# Patient Record
Sex: Female | Born: 1965 | Race: White | Hispanic: No | Marital: Married | Smoking: Never smoker
Health system: Southern US, Community
[De-identification: ages and names within clinical notes are randomized; demographics above are authoritative.]

## PROBLEM LIST (undated history)

## (undated) DIAGNOSIS — F419 Anxiety disorder, unspecified: Secondary | ICD-10-CM

## (undated) DIAGNOSIS — E7800 Pure hypercholesterolemia, unspecified: Secondary | ICD-10-CM

## (undated) DIAGNOSIS — G2581 Restless legs syndrome: Secondary | ICD-10-CM

## (undated) DIAGNOSIS — F32A Depression, unspecified: Secondary | ICD-10-CM

## (undated) DIAGNOSIS — R768 Other specified abnormal immunological findings in serum: Secondary | ICD-10-CM

## (undated) DIAGNOSIS — I7300 Raynaud's syndrome without gangrene: Secondary | ICD-10-CM

## (undated) DIAGNOSIS — D649 Anemia, unspecified: Secondary | ICD-10-CM

## (undated) DIAGNOSIS — Z8719 Personal history of other diseases of the digestive system: Secondary | ICD-10-CM

## (undated) DIAGNOSIS — F418 Other specified anxiety disorders: Secondary | ICD-10-CM

## (undated) DIAGNOSIS — G4730 Sleep apnea, unspecified: Secondary | ICD-10-CM

## (undated) DIAGNOSIS — R519 Headache, unspecified: Secondary | ICD-10-CM

## (undated) DIAGNOSIS — I10 Essential (primary) hypertension: Secondary | ICD-10-CM

## (undated) DIAGNOSIS — J45909 Unspecified asthma, uncomplicated: Secondary | ICD-10-CM

## (undated) DIAGNOSIS — M1990 Unspecified osteoarthritis, unspecified site: Secondary | ICD-10-CM

## (undated) DIAGNOSIS — I639 Cerebral infarction, unspecified: Secondary | ICD-10-CM

## (undated) DIAGNOSIS — I679 Cerebrovascular disease, unspecified: Secondary | ICD-10-CM

## (undated) DIAGNOSIS — K219 Gastro-esophageal reflux disease without esophagitis: Secondary | ICD-10-CM

## (undated) DIAGNOSIS — G4700 Insomnia, unspecified: Secondary | ICD-10-CM

## (undated) DIAGNOSIS — H47333 Pseudopapilledema of optic disc, bilateral: Secondary | ICD-10-CM

## (undated) DIAGNOSIS — M0579 Rheumatoid arthritis with rheumatoid factor of multiple sites without organ or systems involvement: Secondary | ICD-10-CM

## (undated) DIAGNOSIS — G43909 Migraine, unspecified, not intractable, without status migrainosus: Secondary | ICD-10-CM

## (undated) DIAGNOSIS — Z8673 Personal history of transient ischemic attack (TIA), and cerebral infarction without residual deficits: Secondary | ICD-10-CM

## (undated) DIAGNOSIS — Z7960 Long term (current) use of unspecified immunomodulators and immunosuppressants: Secondary | ICD-10-CM

## (undated) DIAGNOSIS — R51 Headache: Secondary | ICD-10-CM

## (undated) HISTORY — PX: KNEE ARTHROSCOPY: SUR90

## (undated) HISTORY — PX: TONSILLECTOMY AND ADENOIDECTOMY: SUR1326

## (undated) HISTORY — PX: TONSILLECTOMY: SUR1361

## (undated) HISTORY — PX: WISDOM TOOTH EXTRACTION: SHX21

## (undated) HISTORY — PX: DILATION AND CURETTAGE OF UTERUS: SHX78

## (undated) HISTORY — PX: APPENDECTOMY: SHX54

## (undated) HISTORY — PX: OOPHORECTOMY: SHX86

## (undated) HISTORY — PX: OTHER SURGICAL HISTORY: SHX169

---

## 1999-01-03 HISTORY — PX: ABDOMINAL HYSTERECTOMY: SHX81

## 2003-11-19 ENCOUNTER — Emergency Department: Payer: Self-pay | Admitting: Emergency Medicine

## 2004-08-16 ENCOUNTER — Other Ambulatory Visit: Payer: Self-pay

## 2004-08-16 ENCOUNTER — Emergency Department: Payer: Self-pay | Admitting: Emergency Medicine

## 2004-11-27 ENCOUNTER — Emergency Department: Payer: Self-pay | Admitting: Emergency Medicine

## 2005-07-25 ENCOUNTER — Other Ambulatory Visit: Payer: Self-pay

## 2005-07-25 ENCOUNTER — Emergency Department: Payer: Self-pay | Admitting: Emergency Medicine

## 2005-12-03 ENCOUNTER — Emergency Department: Payer: Self-pay | Admitting: Unknown Physician Specialty

## 2005-12-03 ENCOUNTER — Other Ambulatory Visit: Payer: Self-pay

## 2006-02-09 ENCOUNTER — Ambulatory Visit: Payer: Self-pay | Admitting: Gastroenterology

## 2006-03-22 ENCOUNTER — Ambulatory Visit: Payer: Self-pay | Admitting: Gastroenterology

## 2006-04-22 ENCOUNTER — Emergency Department: Payer: Self-pay | Admitting: Emergency Medicine

## 2007-03-17 ENCOUNTER — Ambulatory Visit: Payer: Self-pay | Admitting: Family Medicine

## 2010-07-15 ENCOUNTER — Ambulatory Visit: Payer: Self-pay | Admitting: Internal Medicine

## 2011-03-14 ENCOUNTER — Ambulatory Visit: Payer: Self-pay | Admitting: Internal Medicine

## 2011-04-12 ENCOUNTER — Ambulatory Visit: Payer: Self-pay | Admitting: Family Medicine

## 2013-05-10 ENCOUNTER — Emergency Department: Payer: Self-pay | Admitting: Emergency Medicine

## 2013-05-10 LAB — BASIC METABOLIC PANEL
Anion Gap: 5 — ABNORMAL LOW (ref 7–16)
BUN: 14 mg/dL (ref 7–18)
CREATININE: 0.78 mg/dL (ref 0.60–1.30)
Calcium, Total: 9.1 mg/dL (ref 8.5–10.1)
Chloride: 106 mmol/L (ref 98–107)
Co2: 27 mmol/L (ref 21–32)
GLUCOSE: 87 mg/dL (ref 65–99)
OSMOLALITY: 276 (ref 275–301)
Potassium: 4 mmol/L (ref 3.5–5.1)
Sodium: 138 mmol/L (ref 136–145)

## 2013-05-10 LAB — CBC
HCT: 43.6 % (ref 35.0–47.0)
HGB: 13.6 g/dL (ref 12.0–16.0)
MCH: 28 pg (ref 26.0–34.0)
MCHC: 31.3 g/dL — AB (ref 32.0–36.0)
MCV: 90 fL (ref 80–100)
Platelet: 301 10*3/uL (ref 150–440)
RBC: 4.86 10*6/uL (ref 3.80–5.20)
RDW: 13.6 % (ref 11.5–14.5)
WBC: 11 10*3/uL (ref 3.6–11.0)

## 2013-05-10 LAB — TROPONIN I: Troponin-I: <0.02 ng/mL

## 2013-05-16 ENCOUNTER — Emergency Department: Payer: Self-pay | Admitting: Emergency Medicine

## 2013-05-28 ENCOUNTER — Emergency Department: Payer: Self-pay | Admitting: Emergency Medicine

## 2013-05-28 LAB — BASIC METABOLIC PANEL
Anion Gap: 11 (ref 7–16)
BUN: 12 mg/dL (ref 7–18)
CALCIUM: 9.1 mg/dL (ref 8.5–10.1)
CREATININE: 0.83 mg/dL (ref 0.60–1.30)
Chloride: 108 mmol/L — ABNORMAL HIGH (ref 98–107)
Co2: 21 mmol/L (ref 21–32)
EGFR (Non-African Amer.): >60
GLUCOSE: 107 mg/dL — AB (ref 65–99)
OSMOLALITY: 280 (ref 275–301)
POTASSIUM: 3.4 mmol/L — AB (ref 3.5–5.1)
SODIUM: 140 mmol/L (ref 136–145)

## 2013-05-28 LAB — CBC
HCT: 41.7 % (ref 35.0–47.0)
HGB: 13.6 g/dL (ref 12.0–16.0)
MCH: 29.2 pg (ref 26.0–34.0)
MCHC: 32.6 g/dL (ref 32.0–36.0)
MCV: 90 fL (ref 80–100)
Platelet: 370 10*3/uL (ref 150–440)
RBC: 4.66 10*6/uL (ref 3.80–5.20)
RDW: 13.7 % (ref 11.5–14.5)
WBC: 11.2 10*3/uL — AB (ref 3.6–11.0)

## 2013-05-28 LAB — TROPONIN I: Troponin-I: <0.02 ng/mL

## 2014-08-10 ENCOUNTER — Other Ambulatory Visit: Payer: Self-pay | Admitting: Family Medicine

## 2014-08-10 DIAGNOSIS — Z1231 Encounter for screening mammogram for malignant neoplasm of breast: Secondary | ICD-10-CM

## 2014-08-21 ENCOUNTER — Ambulatory Visit: Payer: Self-pay

## 2014-09-11 ENCOUNTER — Ambulatory Visit
Admission: RE | Admit: 2014-09-11 | Discharge: 2014-09-11 | Disposition: A | Discharge status: Home / Self Care | Payer: No Typology Code available for payment source | Source: Ambulatory Visit | Attending: Family Medicine | Admitting: Family Medicine

## 2014-09-11 DIAGNOSIS — Z1231 Encounter for screening mammogram for malignant neoplasm of breast: Secondary | ICD-10-CM | POA: Diagnosis not present

## 2015-03-18 ENCOUNTER — Other Ambulatory Visit: Payer: Self-pay | Admitting: Family Medicine

## 2015-03-18 DIAGNOSIS — Z1231 Encounter for screening mammogram for malignant neoplasm of breast: Secondary | ICD-10-CM

## 2015-09-17 ENCOUNTER — Ambulatory Visit
Admission: RE | Admit: 2015-09-17 | Discharge: 2015-09-17 | Disposition: A | Discharge status: Home / Self Care | Payer: No Typology Code available for payment source | Source: Ambulatory Visit | Attending: Family Medicine | Admitting: Family Medicine

## 2015-09-17 DIAGNOSIS — R928 Other abnormal and inconclusive findings on diagnostic imaging of breast: Secondary | ICD-10-CM | POA: Insufficient documentation

## 2015-09-17 DIAGNOSIS — Z1231 Encounter for screening mammogram for malignant neoplasm of breast: Secondary | ICD-10-CM

## 2015-09-21 ENCOUNTER — Other Ambulatory Visit: Payer: Self-pay | Admitting: Family Medicine

## 2015-09-21 DIAGNOSIS — N6310 Unspecified lump in the right breast, unspecified quadrant: Secondary | ICD-10-CM

## 2015-09-22 ENCOUNTER — Ambulatory Visit
Admission: RE | Admit: 2015-09-22 | Discharge: 2015-09-22 | Disposition: A | Discharge status: Home / Self Care | Payer: No Typology Code available for payment source | Source: Ambulatory Visit | Attending: Family Medicine | Admitting: Family Medicine

## 2015-09-22 DIAGNOSIS — N63 Unspecified lump in breast: Secondary | ICD-10-CM | POA: Insufficient documentation

## 2015-09-22 DIAGNOSIS — N6310 Unspecified lump in the right breast, unspecified quadrant: Secondary | ICD-10-CM

## 2015-10-05 ENCOUNTER — Other Ambulatory Visit: Payer: Self-pay | Admitting: Family Medicine

## 2015-10-05 DIAGNOSIS — R928 Other abnormal and inconclusive findings on diagnostic imaging of breast: Secondary | ICD-10-CM

## 2015-10-12 ENCOUNTER — Other Ambulatory Visit: Payer: Self-pay | Admitting: Family Medicine

## 2015-10-12 DIAGNOSIS — R928 Other abnormal and inconclusive findings on diagnostic imaging of breast: Secondary | ICD-10-CM

## 2016-01-04 ENCOUNTER — Other Ambulatory Visit: Payer: Self-pay | Admitting: Gastroenterology

## 2016-01-04 DIAGNOSIS — R1310 Dysphagia, unspecified: Secondary | ICD-10-CM

## 2016-01-11 ENCOUNTER — Ambulatory Visit: Payer: No Typology Code available for payment source

## 2016-01-14 ENCOUNTER — Ambulatory Visit: Payer: No Typology Code available for payment source

## 2016-01-21 ENCOUNTER — Ambulatory Visit
Admission: RE | Admit: 2016-01-21 | Discharge: 2016-01-21 | Disposition: A | Discharge status: Home / Self Care | Payer: No Typology Code available for payment source | Source: Ambulatory Visit | Attending: Gastroenterology | Admitting: Gastroenterology

## 2016-01-21 DIAGNOSIS — R1310 Dysphagia, unspecified: Secondary | ICD-10-CM | POA: Diagnosis present

## 2016-01-21 DIAGNOSIS — K222 Esophageal obstruction: Secondary | ICD-10-CM | POA: Insufficient documentation

## 2016-01-21 DIAGNOSIS — K449 Diaphragmatic hernia without obstruction or gangrene: Secondary | ICD-10-CM | POA: Insufficient documentation

## 2016-04-12 ENCOUNTER — Encounter: Payer: Self-pay | Admitting: *Deleted

## 2016-04-13 ENCOUNTER — Ambulatory Visit: Payer: No Typology Code available for payment source | Admitting: Anesthesiology

## 2016-04-13 ENCOUNTER — Ambulatory Visit
Admission: RE | Admit: 2016-04-13 | Discharge: 2016-04-13 | Disposition: A | Discharge status: Home / Self Care | Payer: No Typology Code available for payment source | Source: Ambulatory Visit | Attending: Gastroenterology | Admitting: Gastroenterology

## 2016-04-13 ENCOUNTER — Encounter
Admission: RE | Disposition: A | Discharge status: Home / Self Care | Payer: Self-pay | Source: Ambulatory Visit | Attending: Gastroenterology

## 2016-04-13 ENCOUNTER — Encounter: Payer: Self-pay | Admitting: Anesthesiology

## 2016-04-13 DIAGNOSIS — Z882 Allergy status to sulfonamides status: Secondary | ICD-10-CM | POA: Insufficient documentation

## 2016-04-13 DIAGNOSIS — I10 Essential (primary) hypertension: Secondary | ICD-10-CM | POA: Insufficient documentation

## 2016-04-13 DIAGNOSIS — Z7951 Long term (current) use of inhaled steroids: Secondary | ICD-10-CM | POA: Diagnosis not present

## 2016-04-13 DIAGNOSIS — G2581 Restless legs syndrome: Secondary | ICD-10-CM | POA: Insufficient documentation

## 2016-04-13 DIAGNOSIS — Z79899 Other long term (current) drug therapy: Secondary | ICD-10-CM | POA: Insufficient documentation

## 2016-04-13 DIAGNOSIS — Z885 Allergy status to narcotic agent status: Secondary | ICD-10-CM | POA: Insufficient documentation

## 2016-04-13 DIAGNOSIS — G4700 Insomnia, unspecified: Secondary | ICD-10-CM | POA: Diagnosis not present

## 2016-04-13 DIAGNOSIS — K2950 Unspecified chronic gastritis without bleeding: Secondary | ICD-10-CM | POA: Diagnosis not present

## 2016-04-13 DIAGNOSIS — R1013 Epigastric pain: Secondary | ICD-10-CM | POA: Insufficient documentation

## 2016-04-13 DIAGNOSIS — K449 Diaphragmatic hernia without obstruction or gangrene: Secondary | ICD-10-CM | POA: Diagnosis not present

## 2016-04-13 DIAGNOSIS — K219 Gastro-esophageal reflux disease without esophagitis: Secondary | ICD-10-CM | POA: Insufficient documentation

## 2016-04-13 DIAGNOSIS — Z91040 Latex allergy status: Secondary | ICD-10-CM | POA: Insufficient documentation

## 2016-04-13 DIAGNOSIS — K222 Esophageal obstruction: Secondary | ICD-10-CM | POA: Insufficient documentation

## 2016-04-13 DIAGNOSIS — J45909 Unspecified asthma, uncomplicated: Secondary | ICD-10-CM | POA: Insufficient documentation

## 2016-04-13 HISTORY — DX: Essential (primary) hypertension: I10

## 2016-04-13 HISTORY — PX: ESOPHAGOGASTRODUODENOSCOPY (EGD) WITH PROPOFOL: SHX5813

## 2016-04-13 HISTORY — DX: Insomnia, unspecified: G47.00

## 2016-04-13 HISTORY — DX: Restless legs syndrome: G25.81

## 2016-04-13 HISTORY — DX: Headache, unspecified: R51.9

## 2016-04-13 HISTORY — DX: Headache: R51

## 2016-04-13 SURGERY — ESOPHAGOGASTRODUODENOSCOPY (EGD) WITH PROPOFOL
Anesthesia: General

## 2016-04-13 MED ORDER — FENTANYL CITRATE (PF) 100 MCG/2ML IJ SOLN
INTRAMUSCULAR | Status: AC
  Filled 2016-04-13: qty 2

## 2016-04-13 MED ORDER — LIDOCAINE HCL (PF) 2 % IJ SOLN
INTRAMUSCULAR | Status: AC
  Filled 2016-04-13: qty 2

## 2016-04-13 MED ORDER — FENTANYL CITRATE (PF) 100 MCG/2ML IJ SOLN
INTRAMUSCULAR | Status: DC | PRN
  Administered 2016-04-13 (×2): 50 ug via INTRAVENOUS

## 2016-04-13 MED ORDER — SODIUM CHLORIDE 0.9 % IV SOLN: INTRAVENOUS | Status: DC

## 2016-04-13 MED ORDER — GLYCOPYRROLATE 0.2 MG/ML IJ SOLN
INTRAMUSCULAR | Status: DC | PRN
  Administered 2016-04-13: 0.1 mg via INTRAVENOUS

## 2016-04-13 MED ORDER — MIDAZOLAM HCL 2 MG/2ML IJ SOLN
INTRAMUSCULAR | Status: AC
  Filled 2016-04-13: qty 2

## 2016-04-13 MED ORDER — MIDAZOLAM HCL 2 MG/2ML IJ SOLN
INTRAMUSCULAR | Status: DC | PRN
Start: 2016-04-13 — End: 2016-04-13
  Administered 2016-04-13 (×2): 1 mg via INTRAVENOUS

## 2016-04-13 MED ORDER — PROPOFOL 500 MG/50ML IV EMUL
INTRAVENOUS | Status: AC
  Filled 2016-04-13: qty 50

## 2016-04-13 MED ORDER — SODIUM CHLORIDE 0.9 % IV SOLN
INTRAVENOUS | Status: DC
  Administered 2016-04-13: 09:00:00 via INTRAVENOUS

## 2016-04-13 MED ORDER — GLYCOPYRROLATE 0.2 MG/ML IJ SOLN
INTRAMUSCULAR | Status: AC
  Filled 2016-04-13: qty 1

## 2016-04-13 MED ORDER — LIDOCAINE HCL (CARDIAC) 20 MG/ML IV SOLN
INTRAVENOUS | Status: DC | PRN
  Administered 2016-04-13: 30 mg via INTRAVENOUS

## 2016-04-13 MED ORDER — SODIUM CHLORIDE 0.9 % IV SOLN
INTRAVENOUS | Status: DC
Start: 2016-04-13 — End: 2016-04-13

## 2016-04-13 MED ORDER — PROPOFOL 500 MG/50ML IV EMUL
INTRAVENOUS | Status: DC | PRN
  Administered 2016-04-13: 120 ug/kg/min via INTRAVENOUS

## 2016-04-13 NOTE — Anesthesia Procedure Notes (Signed)
Performed by: COOK-MARTIN, Alie Moudy Pre-anesthesia Checklist: Patient identified, Emergency Drugs available, Suction available, Patient being monitored and Timeout performed Patient Re-evaluated:Patient Re-evaluated prior to inductionOxygen Delivery Method: Nasal cannula Preoxygenation: Pre-oxygenation with 100% oxygen Intubation Type: IV induction Placement Confirmation: positive ETCO2 and CO2 detector       

## 2016-04-13 NOTE — Anesthesia Procedure Notes (Signed)
Performed by: COOK-MARTIN, Jenayah Antu Pre-anesthesia Checklist: Patient identified, Emergency Drugs available, Suction available, Patient being monitored and Timeout performed Patient Re-evaluated:Patient Re-evaluated prior to inductionOxygen Delivery Method: Nasal cannula Preoxygenation: Pre-oxygenation with 100% oxygen Intubation Type: IV induction Airway Equipment and Method: Bite block Placement Confirmation: CO2 detector and positive ETCO2     

## 2016-04-13 NOTE — Anesthesia Postprocedure Evaluation (Signed)
Anesthesia Post Note  Patient: Brandi Rose  Procedure(s) Performed: Procedure(s) (LRB): ESOPHAGOGASTRODUODENOSCOPY (EGD) WITH PROPOFOL (N/A)  Patient location during evaluation: Endoscopy Anesthesia Type: General Level of consciousness: awake and alert Pain management: pain level controlled Vital Signs Assessment: post-procedure vital signs reviewed and stable Respiratory status: spontaneous breathing, nonlabored ventilation, respiratory function stable and patient connected to nasal cannula oxygen Cardiovascular status: blood pressure returned to baseline and stable Postop Assessment: no signs of nausea or vomiting Anesthetic complications: no     Last Vitals:  Vitals:   04/13/16 1020 04/13/16 1030  BP: (!) 144/99 (!) 154/93  Pulse: 86 82  Resp: 13 15  Temp:      Last Pain:  Vitals:   04/13/16 1010  TempSrc: Tympanic                 Brandi Rose

## 2016-04-13 NOTE — Anesthesia Post-op Follow-up Note (Cosign Needed)
Anesthesia QCDR form completed.        

## 2016-04-13 NOTE — H&P (Addendum)
Outpatient short stay form Pre-procedure 04/13/2016 9:18 AM Brandi Deem MD  Primary Physician: Dr. Leim Fabry  Reason for visit:  EGD  History of present illness:  Patient is a 51 year old female presenting today with complaint of dysphagia, GERD and epigastric pain. She did have an EGD over 10 years ago that included an empiric dilatation. She did have a barium swallow done on 01/21/2016 that showed a possible B ring in the distal esophagus however  barium tablet did not catch. She has not been taking a proton pump inhibitor until she saw an outpatient. He is now currently taking that feels it may be some improvement. He was previously regurgitating foods. Patient denies any aspirin products or blood thinning agents.    Current Facility-Administered Medications:  .  0.9 %  sodium chloride infusion, , Intravenous, Continuous, Brandi Deem, MD .  0.9 %  sodium chloride infusion, , Intravenous, Continuous, Brandi Deem, MD, Last Rate: 20 mL/hr at 04/13/16 0918 .  0.9 %  sodium chloride infusion, , Intravenous, Continuous, Brandi Deem, MD  Prescriptions Prior to Admission  Medication Sig Dispense Refill Last Dose  . ALBUTEROL IN Inhale 2 Inhalers into the lungs 4 (four) times daily. 2 inhalations into the lungs every 4 hours as needed for wheezing     . ARIPiprazole (ABILIFY) 5 MG tablet Take 5 mg by mouth daily.   04/12/2016 at Unknown time  . buPROPion (WELLBUTRIN XL) 300 MG 24 hr tablet Take 300 mg by mouth daily.   04/12/2016 at Unknown time  . gabapentin (NEURONTIN) 300 MG capsule Take 600 mg by mouth at bedtime. 2 capsules (600mg  total) by mouth nightly   04/12/2016 at Unknown time  . SUMAtriptan (IMITREX) 50 MG tablet Take 50 mg by mouth once. May repeat in 2 hours if headache persists or recurs.   Past Month at Unknown time  . traZODone (DESYREL) 50 MG tablet Take 50 mg by mouth at bedtime.   04/12/2016 at Unknown time  . valACYclovir (VALTREX) 1000 MG tablet Take  1,000 mg by mouth.   Past Month at Unknown time     Allergies  Allergen Reactions  . Latex   . Sulfa Antibiotics Other (See Comments)    Bad headach  . Vicodin [Hydrocodone-Acetaminophen]      Past Medical History:  Diagnosis Date  . Headache   . Hypertension   . Insomnia   . RLS (restless legs syndrome)     Review of systems:      Physical Exam    Heart and lungs: Regular rate and rhythm without rub or gallop, lungs are bilaterally clear.    HEENT: Normocephalic atraumatic eyes are anicteric    Other:     Pertinant exam for procedure: Soft nontender nondistended bowel sounds positive normoactive.    Planned proceedures: EGD and indicated procedures. I have discussed the risks benefits and complications of procedures to include not limited to bleeding, infection, perforation and the risk of sedation and the patient wishes to proceed.    06/12/2016, MD Gastroenterology 04/13/2016  9:18 AM

## 2016-04-13 NOTE — Op Note (Signed)
[facility] Gastroenterology Patient Name: Brandi Rose Procedure Date: 04/13/2016 9:07 AM MRN: [id number] Account #: [id number] Date of Birth: 01-Nov-1965 Admit Type: Outpatient Age: 51 Room: [facility] ENDO ROOM 1 Gender: Female Note Status: Finalized Procedure:            Upper GI endoscopy Indications:          Epigastric abdominal pain, Dysphagia Providers:            Christena Deem, MD Referring MD:         Leim Fabry MD, MD (Referring MD) Medicines:            Monitored Anesthesia Care Complications:        No immediate complications. Procedure:            Pre-Anesthesia Assessment:                       - ASA Grade Assessment: II - A patient with mild                        systemic disease.                       After obtaining informed consent, the endoscope was                        passed under direct vision. Throughout the procedure,                        the patient's blood pressure, pulse, and oxygen                        saturations were monitored continuously. The Endoscope                        was introduced through the mouth, and advanced to the                        third part of duodenum. The upper GI endoscopy was                        accomplished without difficulty. The patient tolerated                        the procedure well. Findings:      A small hiatal hernia was found. The Z-line was a variable distance from       incisors; the hiatal hernia was sliding.      A low-grade of narrowing Schatzki ring (acquired) was found at the       gastroesophageal junction. A TTS dilator was passed through the scope.       Dilation with a 15-16.5-18 mm balloon dilator was performed to 18 mm       with opening of the ring at 18 mm. Biopsies were taken with a cold       forceps for histology.      Patchy minimal inflammation characterized by erythema was found in the       gastric body. Biopsies were taken with a cold forceps for  histology.       Biopsies were taken with a cold forceps for Helicobacter pylori testing.      The cardia  and gastric fundus were normal on retroflexion.      The examined duodenum was normal. Impression:           - Small hiatal hernia.                       - Low-grade of narrowing Schatzki ring. Dilated.                        Biopsied.                       - Gastritis. Biopsied.                       - Normal examined duodenum. Recommendation:       - Clear liquid diet today.                       - Full liquid diet for 1 day, then advance as tolerated                        to soft diet for 4 days. Procedure Code(s):    --- Professional ---                       [phone], Esophagogastroduodenoscopy, flexible, transoral;                        with transendoscopic balloon dilation of esophagus                        (less than 30 mm diameter)                       43239, Esophagogastroduodenoscopy, flexible, transoral;                        with biopsy, single or multiple Diagnosis Code(s):    --- Professional ---                       K44.9, Diaphragmatic hernia without obstruction or                        gangrene                       K22.2, Esophageal obstruction                       K29.70, Gastritis, unspecified, without bleeding                       R10.13, Epigastric pain                       R13.10, Dysphagia, unspecified CPT copyright 2016 American Medical Association. All rights reserved. The codes documented in this report are preliminary and upon coder review may  be revised to meet current compliance requirements. Christena Deem, MD 04/13/2016 10:13:06 AM This report has been signed electronically. Number of Addenda: 0 Note Initiated On: 04/13/2016 9:07 AM      [facility]

## 2016-04-13 NOTE — Anesthesia Preprocedure Evaluation (Signed)
Anesthesia Evaluation  Patient identified by MRN, date of birth, ID band Patient awake    Reviewed: Allergy & Precautions, H&P , NPO status , Patient's Chart, lab work & pertinent test results, reviewed documented beta blocker date and time   History of Anesthesia Complications Negative for: history of anesthetic complications  Airway Mallampati: III  TM Distance: >3 FB Neck ROM: full  Mouth opening: Limited Mouth Opening  Dental  (+) Implants, Partial Upper, Chipped   Pulmonary neg shortness of breath, asthma , sleep apnea , neg COPD, neg recent URI,           Cardiovascular Exercise Tolerance: Good hypertension, (-) angina(-) CAD, (-) Past MI, (-) Cardiac Stents and (-) CABG (-) dysrhythmias + Valvular Problems/Murmurs      Neuro/Psych  Headaches, neg Seizures negative psych ROS   GI/Hepatic Neg liver ROS, GERD  ,  Endo/Other  negative endocrine ROS  Renal/GU negative Renal ROS  negative genitourinary   Musculoskeletal   Abdominal   Peds  Hematology negative hematology ROS (+)   Anesthesia Other Findings Past Medical History: No date: Headache No date: Hypertension No date: Insomnia No date: RLS (restless legs syndrome)   Reproductive/Obstetrics negative OB ROS                             Anesthesia Physical Anesthesia Plan  ASA: II  Anesthesia Plan: General   Post-op Pain Management:    Induction:   Airway Management Planned:   Additional Equipment:   Intra-op Plan:   Post-operative Plan:   Informed Consent: I have reviewed the patients History and Physical, chart, labs and discussed the procedure including the risks, benefits and alternatives for the proposed anesthesia with the patient or authorized representative who has indicated his/her understanding and acceptance.   Dental Advisory Given  Plan Discussed with: Anesthesiologist, CRNA and Surgeon  Anesthesia Plan  Comments:         Anesthesia Quick Evaluation

## 2016-04-13 NOTE — Transfer of Care (Signed)
Immediate Anesthesia Transfer of Care Note  Patient: Brandi Rose  Procedure(s) Performed: Procedure(s): ESOPHAGOGASTRODUODENOSCOPY (EGD) WITH PROPOFOL (N/A)  Patient Location: PACU  Anesthesia Type:General  Level of Consciousness: awake, oriented and sedated  Airway & Oxygen Therapy: Patient Spontanous Breathing and Patient connected to nasal cannula oxygen  Post-op Assessment: Report given to RN and Post -op Vital signs reviewed and stable  Post vital signs: Reviewed and stable  Last Vitals:  Vitals:   04/13/16 0856  BP: (!) 171/91  Pulse: 83  Resp: 20  Temp: 36.6 C    Last Pain:  Vitals:   04/13/16 0856  TempSrc: Oral         Complications: No apparent anesthesia complications

## 2016-04-14 ENCOUNTER — Encounter: Payer: Self-pay | Admitting: Gastroenterology

## 2016-04-14 LAB — SURGICAL PATHOLOGY

## 2016-05-18 ENCOUNTER — Inpatient Hospital Stay: Admission: RE | Admit: 2016-05-18 | Payer: No Typology Code available for payment source | Source: Ambulatory Visit

## 2016-05-18 ENCOUNTER — Other Ambulatory Visit: Payer: No Typology Code available for payment source

## 2016-06-09 ENCOUNTER — Ambulatory Visit
Admission: RE | Admit: 2016-06-09 | Discharge: 2016-06-09 | Disposition: A | Discharge status: Home / Self Care | Payer: No Typology Code available for payment source | Source: Ambulatory Visit | Attending: Family Medicine | Admitting: Family Medicine

## 2016-06-09 DIAGNOSIS — R928 Other abnormal and inconclusive findings on diagnostic imaging of breast: Secondary | ICD-10-CM | POA: Diagnosis present

## 2016-10-19 ENCOUNTER — Encounter: Payer: Self-pay | Admitting: *Deleted

## 2016-10-20 ENCOUNTER — Ambulatory Visit
Admission: RE | Admit: 2016-10-20 | Payer: No Typology Code available for payment source | Source: Ambulatory Visit | Admitting: Gastroenterology

## 2016-10-20 ENCOUNTER — Encounter: Admission: RE | Payer: Self-pay | Source: Ambulatory Visit

## 2016-10-20 HISTORY — DX: Unspecified asthma, uncomplicated: J45.909

## 2016-10-20 HISTORY — DX: Personal history of other diseases of the digestive system: Z87.19

## 2016-10-20 HISTORY — DX: Gastro-esophageal reflux disease without esophagitis: K21.9

## 2016-10-20 SURGERY — COLONOSCOPY WITH PROPOFOL
Anesthesia: General

## 2016-12-14 ENCOUNTER — Other Ambulatory Visit: Payer: Self-pay

## 2016-12-14 ENCOUNTER — Emergency Department: Payer: 59

## 2016-12-14 ENCOUNTER — Emergency Department
Admission: EM | Admit: 2016-12-14 | Discharge: 2016-12-14 | Disposition: A | Discharge status: Home / Self Care | Payer: 59 | Attending: Emergency Medicine | Admitting: Emergency Medicine

## 2016-12-14 DIAGNOSIS — J45909 Unspecified asthma, uncomplicated: Secondary | ICD-10-CM | POA: Insufficient documentation

## 2016-12-14 DIAGNOSIS — Z91040 Latex allergy status: Secondary | ICD-10-CM | POA: Insufficient documentation

## 2016-12-14 DIAGNOSIS — I10 Essential (primary) hypertension: Secondary | ICD-10-CM | POA: Insufficient documentation

## 2016-12-14 DIAGNOSIS — Z79899 Other long term (current) drug therapy: Secondary | ICD-10-CM | POA: Insufficient documentation

## 2016-12-14 DIAGNOSIS — K224 Dyskinesia of esophagus: Secondary | ICD-10-CM | POA: Insufficient documentation

## 2016-12-14 DIAGNOSIS — R220 Localized swelling, mass and lump, head: Secondary | ICD-10-CM | POA: Diagnosis present

## 2016-12-14 MED ORDER — ONDANSETRON HCL 4 MG/2ML IJ SOLN
4.0000 mg | Freq: Once | INTRAMUSCULAR | Status: AC
  Administered 2016-12-14: 4 mg via INTRAVENOUS

## 2016-12-14 MED ORDER — ONDANSETRON HCL 4 MG/2ML IJ SOLN
INTRAMUSCULAR | Status: AC
  Filled 2016-12-14: qty 2

## 2016-12-14 MED ORDER — GLUCAGON HCL RDNA (DIAGNOSTIC) 1 MG IJ SOLR
INTRAMUSCULAR | Status: AC
  Administered 2016-12-14: 1 mg via INTRAMUSCULAR
  Filled 2016-12-14: qty 1

## 2016-12-14 MED ORDER — GLUCAGON HCL RDNA (DIAGNOSTIC) 1 MG IJ SOLR
1.0000 mg | Freq: Once | INTRAMUSCULAR | Status: AC
  Administered 2016-12-14: 1 mg via INTRAMUSCULAR

## 2016-12-14 NOTE — ED Provider Notes (Signed)
[facility] Emergency Department Provider Note ____________________________________________   First MD Initiated Contact with Patient 12/14/16 1929     (approximate)  I have reviewed the triage vital signs and the nursing notes.   HISTORY  Chief Complaint Oral Swelling    HPI GALILEA RECLA is a 51 y.o. female with a history of esophageal spasm and Schatzki ring who presents with dysphagia, acute onset approximately 1 hour ago when she ate some bread, and feeling identical to prior esophageal spasm symptoms.  Patient states she is unable to hold anything down, and whenever she tries to swallow it comes right back up, including her saliva.  She denies any shortness of breath or tightness or swelling in her mouth or throat specifically.  No chest pain.  Patient reports discomfort along her esophagus in her neck.  She denies eating anything else besides the bread before this started.   Past Medical History:  Diagnosis Date  . Asthma   . GERD (gastroesophageal reflux disease)   . Headache   . History of IBS   . Hypertension   . Insomnia   . RLS (restless legs syndrome)     There are no active problems to display for this patient.   Past Surgical History:  Procedure Laterality Date  . ABDOMINAL HYSTERECTOMY  2001  . APPENDECTOMY    . ESOPHAGOGASTRODUODENOSCOPY (EGD) WITH PROPOFOL N/A 04/13/2016   Procedure: ESOPHAGOGASTRODUODENOSCOPY (EGD) WITH PROPOFOL;  Surgeon: Christena Deem, MD;  Location: [facility] ENDOSCOPY;  Service: Endoscopy;  Laterality: N/A;  . KNEE ARTHROSCOPY    . TONSILLECTOMY      Prior to Admission medications   Medication Sig Start Date End Date Taking? Authorizing Provider  ALBUTEROL IN Inhale 2 Inhalers into the lungs 4 (four) times daily. 2 inhalations into the lungs every 4 hours as needed for wheezing    [provider]  ARIPiprazole (ABILIFY) 5 MG tablet Take 5 mg by mouth daily.    [provider]    buPROPion (WELLBUTRIN XL) 300 MG 24 hr tablet Take 300 mg by mouth daily.    [provider]  fluticasone (VERAMYST) 27.5 MCG/SPRAY nasal spray Place 2 sprays into the nose daily.    [provider]  gabapentin (NEURONTIN) 300 MG capsule Take 600 mg by mouth at bedtime. 2 capsules (600mg  total) by mouth nightly    [provider]  lisinopril-hydrochlorothiazide (PRINZIDE,ZESTORETIC) 20-12.5 MG tablet Take 1 tablet by mouth daily.    [provider]  pantoprazole (PROTONIX) 40 MG tablet Take 40 mg by mouth daily.    [provider]  SUMAtriptan (IMITREX) 50 MG tablet Take 50 mg by mouth once. May repeat in 2 hours if headache persists or recurs.    [provider]  traZODone (DESYREL) 50 MG tablet Take 50 mg by mouth at bedtime.    [provider]  valACYclovir (VALTREX) 1000 MG tablet Take 1,000 mg by mouth.    [provider]    Allergies Latex; Sulfa antibiotics; and Vicodin [hydrocodone-acetaminophen]  Family History  Problem Relation Age of Onset  . Breast cancer Paternal Grandmother 39    Social History Social History   Tobacco Use  . Smoking status: Never Smoker  . Smokeless tobacco: Never Used  Substance Use Topics  . Alcohol use: No  . Drug use: No    Review of Systems  Constitutional: No fever. Eyes: No redness. ENT: No sore throat. Cardiovascular: Denies chest pain. Respiratory: Denies shortness of breath.  Gastrointestinal: Positive for dysphagia Genitourinary: Negative for flank pain.  Musculoskeletal: Negative for back pain. Skin: Negative for rash. Neurological: Negative for headache.    ____________________________________________   PHYSICAL EXAM:  VITAL SIGNS: ED Triage Vitals  Enc Vitals Group     BP 12/14/16 1911 (!) 221/115     Pulse Rate 12/14/16 1911 97     Resp 12/14/16 1911 20     Temp 12/14/16 1911 98.3 F (36.8 C)     Temp Source 12/14/16 1911 Oral     SpO2  12/14/16 1911 100 %     Weight 12/14/16 1914 210 lb (95.3 kg)     Height 12/14/16 1914 5\' 8"  (1.727 m)     Head Circumference --      Peak Flow --      Pain Score 12/14/16 1911 8     Pain Loc --      Pain Edu? --      Excl. in GC? --     Constitutional: Alert and oriented.  Uncomfortable appearing but in no acute distress. Eyes: Conjunctivae are normal.  Head: Atraumatic. Nose: No congestion/rhinnorhea. Mouth/Throat: Mucous membranes are moist.  Oropharynx is clear with no swelling.  Small amount of pooled secretions.  No stridor. Neck: Normal range of motion.  Cardiovascular: Normal rate, regular rhythm. Grossly normal heart sounds.  Good peripheral circulation. Respiratory: Normal respiratory effort.  No retractions. Lungs CTAB. Gastrointestinal: No distention.  Musculoskeletal: Extremities warm and well perfused.  Neurologic:  Normal speech and language. No gross focal neurologic deficits are appreciated.  Skin:  Skin is warm and dry. No rash noted. Psychiatric: Mood and affect are normal. Speech and behavior are normal.  ____________________________________________   LABS (all labs ordered are listed, but only abnormal results are displayed)  Labs Reviewed - No data to display ____________________________________________  EKG   ____________________________________________  RADIOLOGY  CXR: No acute findings.  ____________________________________________   PROCEDURES  Procedure(s) performed: No    Critical Care performed: No ____________________________________________   INITIAL IMPRESSION / ASSESSMENT AND PLAN / ED COURSE  Pertinent labs & imaging results that were available during my care of the patient were reviewed by me and considered in my medical decision making (see chart for details).  51 year old female with past medical history as noted above presents with symptoms similar to prior esophageal spasm after eating some bread, including discomfort  along her esophagus and inability to tolerate anything by mouth including her saliva.  Review of past medical records in Epic confirms that patient had endoscopy approximately 8 months ago during which she was found to have Schatzki ring and the esophagus was dilated.   Patient reports a few similar episodes that have resolved on their own but usually have not been as intense as this one.  On exam, patient is uncomfortable but not acutely ill-appearing.  She is significantly hypertensive, but other vital signs are normal.  She states she has not been compliant with her blood pressure medications.  The remainder of the exam is unremarkable except for the small amount of pooled secretions in the oropharynx, but there is no swelling in the oropharynx or evidence of airway involvement.  Presentation is consistent with esophageal spasm or stricture.  Given the patient was only eating bread which will dissolve, I am less concerned for food bolus as the primary cause.  Plan: Chest x-ray, IM glucagon, and reassess.  If patient's symptoms resolved and she is able to tolerate p.o. again, likely discharge home.  If  patient's symptoms persist we will consult GI.  Patient's hypertension is consistent with a patient in acute discomfort and especially since she is not compliant with her antihypertensives, and therefore is likely acute on chronic.  Patient has no headache, chest pain, difficulty breathing, or other symptoms of endorgan dysfunction or evidence of hypertensive emergency.  We will continue to monitor.    ----------------------------------------- 9:48 PM on 12/14/2016 -----------------------------------------  Patient reports significant improvement in her symptoms after the glucagon, and now is drinking ginger ale without difficulty and not having any regurgitation or vomiting.  Patient feels much better and would like to go home.  I instructed her to drink only liquids tonight, and advance to soft  food tomorrow.  I will provide referral back to her own gastroenterologist who had previously done an endoscopy on her, and instructed her to call tomorrow.  Patient's blood pressures also spontaneously improved, with the last blood pressure being 180s over 100s when I was in the room with her.  Patient continues to have no symptoms of hypertensive urgency.  Return precautions given and patient expresses understanding.  ____________________________________________   FINAL CLINICAL IMPRESSION(S) / ED DIAGNOSES  Final diagnoses:  Esophageal spasm      NEW MEDICATIONS STARTED DURING THIS VISIT:  This SmartLink is deprecated. Use AVSMEDLIST instead to display the medication list for a patient.   Note:  This document was prepared using Dragon voice recognition software and may include unintentional dictation errors.    Dionne Bucy, MD 12/14/16 2150

## 2016-12-14 NOTE — Discharge Instructions (Signed)
You should continue to drink only liquids tonight, advance to soft solid foods tomorrow.  You should call the gastroenterologist tomorrow to arrange a follow-up visit.  Return to the ER immediately for new, worsening, or persistent vomiting, spitting up, inability to swallow normally, neck or chest pain, difficulty breathing, tightness in her throat, or any other new or worsening symptoms that concern you.

## 2016-12-14 NOTE — ED Triage Notes (Addendum)
Pt presents to ED via POV from home with c/o throat/oral swelling x30 mins PTA. PT reports h/x of same requiring esophageal stretching. Pt unable to swallow oral secretions, no difficulty breathing or managing airway at this time.

## 2016-12-14 NOTE — ED Notes (Signed)
Pt reports esophageal spasms that started prioor

## 2017-01-06 ENCOUNTER — Encounter
Admission: EM | Disposition: A | Discharge status: Home / Self Care | Payer: Self-pay | Source: Home / Self Care | Attending: Student in an Organized Health Care Education/Training Program

## 2017-01-06 ENCOUNTER — Emergency Department: Payer: 59

## 2017-01-06 ENCOUNTER — Emergency Department
Admission: EM | Admit: 2017-01-06 | Discharge: 2017-01-06 | Disposition: A | Discharge status: Home / Self Care | Payer: 59 | Attending: Student in an Organized Health Care Education/Training Program | Admitting: Student in an Organized Health Care Education/Training Program

## 2017-01-06 DIAGNOSIS — R0789 Other chest pain: Secondary | ICD-10-CM | POA: Insufficient documentation

## 2017-01-06 DIAGNOSIS — Z91040 Latex allergy status: Secondary | ICD-10-CM | POA: Insufficient documentation

## 2017-01-06 DIAGNOSIS — J45909 Unspecified asthma, uncomplicated: Secondary | ICD-10-CM | POA: Insufficient documentation

## 2017-01-06 DIAGNOSIS — Z79899 Other long term (current) drug therapy: Secondary | ICD-10-CM | POA: Insufficient documentation

## 2017-01-06 DIAGNOSIS — K224 Dyskinesia of esophagus: Secondary | ICD-10-CM | POA: Diagnosis not present

## 2017-01-06 DIAGNOSIS — I10 Essential (primary) hypertension: Secondary | ICD-10-CM | POA: Insufficient documentation

## 2017-01-06 DIAGNOSIS — R079 Chest pain, unspecified: Secondary | ICD-10-CM | POA: Diagnosis present

## 2017-01-06 LAB — CBC
HEMATOCRIT: 39.9 % (ref 35.0–47.0)
Hemoglobin: 13.3 g/dL (ref 12.0–16.0)
MCH: 28.5 pg (ref 26.0–34.0)
MCHC: 33.3 g/dL (ref 32.0–36.0)
MCV: 85.8 fL (ref 80.0–100.0)
Platelets: 271 10*3/uL (ref 150–440)
RBC: 4.65 MIL/uL (ref 3.80–5.20)
RDW: 13.1 % (ref 11.5–14.5)
WBC: 5.2 10*3/uL (ref 3.6–11.0)

## 2017-01-06 LAB — BASIC METABOLIC PANEL
ANION GAP: 9 (ref 5–15)
BUN: 13 mg/dL (ref 6–20)
CO2: 23 mmol/L (ref 22–32)
Calcium: 8.8 mg/dL — ABNORMAL LOW (ref 8.9–10.3)
Chloride: 104 mmol/L (ref 101–111)
Creatinine, Ser: 0.89 mg/dL (ref 0.44–1.00)
GFR calc Af Amer: >60 mL/min (ref >60)
GLUCOSE: 102 mg/dL — AB (ref 65–99)
Potassium: 3.6 mmol/L (ref 3.5–5.1)
Sodium: 136 mmol/L (ref 135–145)

## 2017-01-06 LAB — TROPONIN I: Troponin I: <0.03 ng/mL (ref <0.03)

## 2017-01-06 SURGERY — EGD (ESOPHAGOGASTRODUODENOSCOPY)
Anesthesia: Choice

## 2017-01-06 MED ORDER — LORAZEPAM 2 MG/ML IJ SOLN
1.0000 mg | Freq: Once | INTRAMUSCULAR | Status: AC
  Administered 2017-01-06: 1 mg via INTRAVENOUS
  Filled 2017-01-06: qty 1

## 2017-01-06 MED ORDER — LABETALOL HCL 5 MG/ML IV SOLN
5.0000 mg | Freq: Once | INTRAVENOUS | Status: AC
  Administered 2017-01-06: 5 mg via INTRAVENOUS
  Filled 2017-01-06: qty 4

## 2017-01-06 MED ORDER — LORAZEPAM 2 MG/ML IJ SOLN
1.0000 mg | Freq: Once | INTRAMUSCULAR | Status: AC
  Administered 2017-01-06: 1 mg via INTRAVENOUS

## 2017-01-06 MED ORDER — LORAZEPAM 2 MG/ML IJ SOLN
INTRAMUSCULAR | Status: AC
  Filled 2017-01-06: qty 1

## 2017-01-06 MED ORDER — LIDOCAINE VISCOUS 2 % MT SOLN: 20.0000 mL | OROMUCOSAL | 0 refills | Status: DC | PRN

## 2017-01-06 MED ORDER — MORPHINE SULFATE (PF) 4 MG/ML IV SOLN
4.0000 mg | INTRAVENOUS | Status: DC | PRN
  Administered 2017-01-06: 4 mg via INTRAVENOUS
  Filled 2017-01-06: qty 1

## 2017-01-06 MED ORDER — PANTOPRAZOLE SODIUM 40 MG IV SOLR
40.0000 mg | Freq: Once | INTRAVENOUS | Status: AC
  Administered 2017-01-06: 40 mg via INTRAVENOUS
  Filled 2017-01-06: qty 40

## 2017-01-06 MED ORDER — LIDOCAINE VISCOUS 2 % MT SOLN
15.0000 mL | Freq: Once | OROMUCOSAL | Status: AC
  Administered 2017-01-06: 15 mL via OROMUCOSAL
  Filled 2017-01-06: qty 15

## 2017-01-06 MED ORDER — GLUCAGON HCL (RDNA) 1 MG IJ SOLR
1.0000 mg | Freq: Once | INTRAMUSCULAR | Status: AC
  Administered 2017-01-06: 1 mg via INTRAVENOUS
  Filled 2017-01-06: qty 1

## 2017-01-06 MED ORDER — PANTOPRAZOLE SODIUM 40 MG PO TBEC: 40.0000 mg | DELAYED_RELEASE_TABLET | Freq: Every day | ORAL | 1 refills | Status: DC

## 2017-01-06 MED ORDER — ONDANSETRON HCL 4 MG/2ML IJ SOLN
4.0000 mg | Freq: Once | INTRAMUSCULAR | Status: AC
  Administered 2017-01-06: 4 mg via INTRAVENOUS
  Filled 2017-01-06: qty 2

## 2017-01-06 MED ORDER — GLUCAGON HCL RDNA (DIAGNOSTIC) 1 MG IJ SOLR
INTRAMUSCULAR | Status: AC
  Administered 2017-01-06: 1 mg via INTRAVENOUS
  Filled 2017-01-06: qty 1

## 2017-01-06 MED ORDER — PROMETHAZINE HCL 25 MG/ML IJ SOLN
12.5000 mg | Freq: Four times a day (QID) | INTRAMUSCULAR | Status: DC | PRN
  Administered 2017-01-06: 12.5 mg via INTRAVENOUS
  Filled 2017-01-06: qty 1

## 2017-01-06 NOTE — Discharge Instructions (Signed)

## 2017-01-06 NOTE — ED Triage Notes (Signed)
Patient c/o generalized chest pain, N/V, SOB, dizziness. Patient actively vomiting in triage. Patient reports hx of esophageal spasms and procedures to have neck stretched.

## 2017-01-06 NOTE — ED Notes (Signed)
Pt given ginger ale for PO challenge per Dr. Roxan Hockey request

## 2017-01-06 NOTE — ED Notes (Signed)
Pt reports she is now able to drink ginger ale, has drank about 8oz

## 2017-01-06 NOTE — ED Provider Notes (Signed)
[facility] Emergency Department Provider Note    First MD Initiated Contact with Patient 01/06/17 2043     (approximate)  I have reviewed the triage vital signs and the nursing notes.   HISTORY  Chief Complaint Chest Pain    HPI Brandi Rose is a 52 y.o. female with a history of reflux as well as esophageal spasm presents with sudden onset chest pain that occurred while she was eating barbecue.  States that she has had food stuck in the past.  States the pain is moderate to severe.  Has had improvement in symptoms with glucagon.  Denies any shortness of breath.  No vomiting.  No nausea.  No diaphoresis.  Past Medical History:  Diagnosis Date  . Asthma   . GERD (gastroesophageal reflux disease)   . Headache   . History of IBS   . Hypertension   . Insomnia   . RLS (restless legs syndrome)    Family History  Problem Relation Age of Onset  . Breast cancer Paternal Grandmother 12   Past Surgical History:  Procedure Laterality Date  . ABDOMINAL HYSTERECTOMY  2001  . APPENDECTOMY    . ESOPHAGOGASTRODUODENOSCOPY (EGD) WITH PROPOFOL N/A 04/13/2016   Procedure: ESOPHAGOGASTRODUODENOSCOPY (EGD) WITH PROPOFOL;  Surgeon: Christena Deem, MD;  Location: [facility] ENDOSCOPY;  Service: Endoscopy;  Laterality: N/A;  . KNEE ARTHROSCOPY    . TONSILLECTOMY     There are no active problems to display for this patient.     Prior to Admission medications   Medication Sig Start Date End Date Taking? Authorizing Provider  ALBUTEROL IN Inhale 2 Inhalers into the lungs 4 (four) times daily. 2 inhalations into the lungs every 4 hours as needed for wheezing    [provider]  ARIPiprazole (ABILIFY) 5 MG tablet Take 5 mg by mouth daily.    [provider]  buPROPion (WELLBUTRIN XL) 300 MG 24 hr tablet Take 300 mg by mouth daily.    [provider]  fluticasone (VERAMYST) 27.5 MCG/SPRAY nasal spray Place 2 sprays into the nose daily.     [provider]  gabapentin (NEURONTIN) 300 MG capsule Take 600 mg by mouth at bedtime. 2 capsules (600mg  total) by mouth nightly    [provider]  lidocaine (XYLOCAINE) 2 % solution Use as directed 20 mLs in the mouth or throat every 4 (four) hours as needed for mouth pain. 01/06/17   03/06/17, MD  lisinopril-hydrochlorothiazide (PRINZIDE,ZESTORETIC) 20-12.5 MG tablet Take 1 tablet by mouth daily.    [provider]  pantoprazole (PROTONIX) 40 MG tablet Take 40 mg by mouth daily.    [provider]  pantoprazole (PROTONIX) 40 MG tablet Take 1 tablet (40 mg total) by mouth daily. 01/06/17 01/06/18  03/07/18, MD  SUMAtriptan (IMITREX) 50 MG tablet Take 50 mg by mouth once. May repeat in 2 hours if headache persists or recurs.    [provider]  traZODone (DESYREL) 50 MG tablet Take 50 mg by mouth at bedtime.    [provider]  valACYclovir (VALTREX) 1000 MG tablet Take 1,000 mg by mouth.    [provider]    Allergies Latex; Sulfa antibiotics; and Vicodin [hydrocodone-acetaminophen]    Social History Social History   Tobacco Use  . Smoking status: Never Smoker  . Smokeless tobacco: Never Used  Substance Use Topics  . Alcohol use: No  . Drug use: No    Review of Systems Patient denies headaches,  rhinorrhea, blurry vision, numbness, shortness of breath, chest pain, edema, cough, abdominal pain, nausea, vomiting, diarrhea, dysuria, fevers, rashes or hallucinations unless otherwise stated above in HPI. ____________________________________________   PHYSICAL EXAM:  VITAL SIGNS: Vitals:   01/06/17 2230 01/06/17 2300  BP: (!) 165/93 (!) 180/104  Pulse: 68 76  Resp: 15 17  Temp:    SpO2: 96% 97%    Constitutional: Alert and oriented.  Uncomfortable appearing but in no acute distress. Eyes: Conjunctivae are normal.  Head: Atraumatic. Nose: No congestion/rhinnorhea. Mouth/Throat: Mucous membranes are  moist.   Neck: No stridor. Painless ROM.  Cardiovascular: Normal rate, regular rhythm. Grossly normal heart sounds.  Good peripheral circulation. Respiratory: Normal respiratory effort.  No retractions. Lungs CTAB. Gastrointestinal: Soft and nontender. No distention. No abdominal bruits. No CVA tenderness. Genitourinary:  Musculoskeletal: No lower extremity tenderness nor edema.  No joint effusions. Neurologic:  Normal speech and language. No gross focal neurologic deficits are appreciated. No facial droop Skin:  Skin is warm, dry and intact. No rash noted. Psychiatric: Anxious and obviously uncomfortable appearing but in no acute distress. ____________________________________________   LABS (all labs ordered are listed, but only abnormal results are displayed)  Results for orders placed or performed during the hospital encounter of 01/06/17 (from the past 24 hour(s))  Basic metabolic panel     Status: Abnormal   Collection Time: 01/06/17  8:42 PM  Result Value Ref Range   Sodium 136 135 - 145 mmol/L   Potassium 3.6 3.5 - 5.1 mmol/L   Chloride 104 101 - 111 mmol/L   CO2 23 22 - 32 mmol/L   Glucose, Bld 102 (H) 65 - 99 mg/dL   BUN 13 6 - 20 mg/dL   Creatinine, Ser 1.54 0.44 - 1.00 mg/dL   Calcium 8.8 (L) 8.9 - 10.3 mg/dL   GFR calc non Af Amer >60 >60 mL/min   GFR calc Af Amer >60 >60 mL/min   Anion gap 9 5 - 15  CBC     Status: None   Collection Time: 01/06/17  8:42 PM  Result Value Ref Range   WBC 5.2 3.6 - 11.0 K/uL   RBC 4.65 3.80 - 5.20 MIL/uL   Hemoglobin 13.3 12.0 - 16.0 g/dL   HCT 00.8 67.6 - 19.5 %   MCV 85.8 80.0 - 100.0 fL   MCH 28.5 26.0 - 34.0 pg   MCHC 33.3 32.0 - 36.0 g/dL   RDW 09.3 26.7 - 12.4 %   Platelets 271 150 - 440 K/uL  Troponin I     Status: None   Collection Time: 01/06/17  8:42 PM  Result Value Ref Range   Troponin I <0.03 <0.03 ng/mL   ____________________________________________  EKG My review and personal interpretation at Time: 20:12     Indication: chest pain  Rate: 80  Rhythm: sinus Axis: normal Other: occasional pvc, no stemi, normal intervasl ____________________________________________  RADIOLOGY  I personally reviewed all radiographic images ordered to evaluate for the above acute complaints and reviewed radiology reports and findings.  These findings were personally discussed with the patient.  Please see medical record for radiology report.  ____________________________________________   PROCEDURES  Procedure(s) performed:  Procedures    Critical Care performed: no ____________________________________________   INITIAL IMPRESSION / ASSESSMENT AND PLAN / ED COURSE  Pertinent labs & imaging results that were available during my care of the patient were reviewed by me and considered in my medical decision making (see chart for details).  DDX: ACS, pericarditis,  esophagitis, boerhaaves, pe, dissection, pna, bronchitis, costochondritis   CAMEKA DEADRICK is a 52 y.o. who presents to the ED with chest pain and dysphagia as described above concerning for esophageal spasm versus food bolus impaction.  Blood work and EKG reassuring and not consistent with ACS dissection pneumothorax or mediastinitis.  Patient given glucagon with some improvement.  Will give IV pain medications and reassess.  Clinical Course as of Jan 07 2332  Sat Jan 06, 2017  2145 Patient reassessed with no improvement after Ativan and glucagon.  Unable to tolerate secretions.  She is protecting her airway.  No evidence of ischemic changes on EKG.  Chest x-ray is clear.  Presentation most concerning for food bolus impaction and therefore will touch base with GI.  [PR]  2226 Dr. Norma Fredrickson of GI who will come evaluate the patient for endoscopy due to concern for food bolus impaction.  Have discussed with the patient and available family all diagnostics and treatments performed thus far and all questions were answered to the best of my ability. The  patient demonstrates understanding and agreement with plan.   [PR]  2240 Patient reassessed and is tolerating ginger ale at this time.  Dr. Norma Fredrickson and updated patient.  She is tolerating secretions.  No vomiting.  [PR]    Clinical Course User Index [PR] Willy Eddy, MD    ----------------------------------------- 11:33 PM on 01/06/2017 -----------------------------------------  Patient is tolerating secretions.  Pain is clinically improved.  To suspect some component of esophageal spasm that is since relieved.  At this point I do believe patient is stable for follow-up with GI.  Discussed strict return precautions and clear liquid diet.  Will encourage patient to restart Protonix.  Have discussed with the patient and available family all diagnostics and treatments performed thus far and all questions were answered to the best of my ability. The patient demonstrates understanding and agreement with plan.  ____________________________________________   FINAL CLINICAL IMPRESSION(S) / ED DIAGNOSES  Final diagnoses:  Atypical chest pain  Esophageal spasm      NEW MEDICATIONS STARTED DURING THIS VISIT:  This SmartLink is deprecated. Use AVSMEDLIST instead to display the medication list for a patient.   Note:  This document was prepared using Dragon voice recognition software and may include unintentional dictation errors.    Willy Eddy, MD 01/06/17 [phone]

## 2017-02-22 ENCOUNTER — Encounter: Payer: Self-pay | Admitting: *Deleted

## 2017-02-23 ENCOUNTER — Encounter: Admission: RE | Payer: Self-pay | Source: Ambulatory Visit

## 2017-02-23 ENCOUNTER — Encounter: Payer: Self-pay | Admitting: Anesthesiology

## 2017-02-23 SURGERY — COLONOSCOPY WITH PROPOFOL
Anesthesia: General

## 2017-02-24 ENCOUNTER — Encounter: Payer: Self-pay | Admitting: Anesthesiology

## 2017-02-26 ENCOUNTER — Ambulatory Visit: Admission: RE | Admit: 2017-02-26 | Payer: 59 | Source: Ambulatory Visit | Admitting: Gastroenterology

## 2018-02-28 ENCOUNTER — Other Ambulatory Visit: Payer: Self-pay | Admitting: Family Medicine

## 2018-02-28 DIAGNOSIS — Z1231 Encounter for screening mammogram for malignant neoplasm of breast: Secondary | ICD-10-CM

## 2018-03-18 ENCOUNTER — Other Ambulatory Visit: Payer: Self-pay

## 2018-03-18 ENCOUNTER — Ambulatory Visit
Admission: RE | Admit: 2018-03-18 | Discharge: 2018-03-18 | Disposition: A | Discharge status: Home / Self Care | Payer: 59 | Source: Ambulatory Visit | Attending: Family Medicine | Admitting: Family Medicine

## 2018-03-18 DIAGNOSIS — Z1231 Encounter for screening mammogram for malignant neoplasm of breast: Secondary | ICD-10-CM | POA: Diagnosis not present

## 2018-03-19 ENCOUNTER — Other Ambulatory Visit: Payer: Self-pay | Admitting: Family Medicine

## 2018-03-19 DIAGNOSIS — R928 Other abnormal and inconclusive findings on diagnostic imaging of breast: Secondary | ICD-10-CM

## 2018-03-19 DIAGNOSIS — N6489 Other specified disorders of breast: Secondary | ICD-10-CM

## 2018-03-25 ENCOUNTER — Ambulatory Visit
Admission: RE | Admit: 2018-03-25 | Discharge: 2018-03-25 | Disposition: A | Discharge status: Home / Self Care | Payer: PRIVATE HEALTH INSURANCE | Source: Ambulatory Visit | Attending: Family Medicine | Admitting: Family Medicine

## 2018-03-25 ENCOUNTER — Other Ambulatory Visit: Payer: Self-pay

## 2018-03-25 DIAGNOSIS — N6489 Other specified disorders of breast: Secondary | ICD-10-CM

## 2018-03-25 DIAGNOSIS — R928 Other abnormal and inconclusive findings on diagnostic imaging of breast: Secondary | ICD-10-CM

## 2018-03-26 ENCOUNTER — Other Ambulatory Visit: Payer: Self-pay | Admitting: Family Medicine

## 2018-03-26 DIAGNOSIS — R928 Other abnormal and inconclusive findings on diagnostic imaging of breast: Secondary | ICD-10-CM

## 2018-03-26 DIAGNOSIS — N6489 Other specified disorders of breast: Secondary | ICD-10-CM

## 2018-04-11 IMAGING — CR DG CHEST 2V
1 series · 2 of 2 positions shown · non-contrast
Comparison: 12/14/2016

CLINICAL DATA: c/o generalized chest pain, N/V, SOB, dizziness.
Patient actively vomiting in triage. Patient reports hx of
esophageal spasms and procedures to have neck stretched

EXAM:
CHEST  2 VIEW

[Series 1: dg chest 2 view · 0.14mm/px · 2 of 2 slices shown]
[im 1/2]
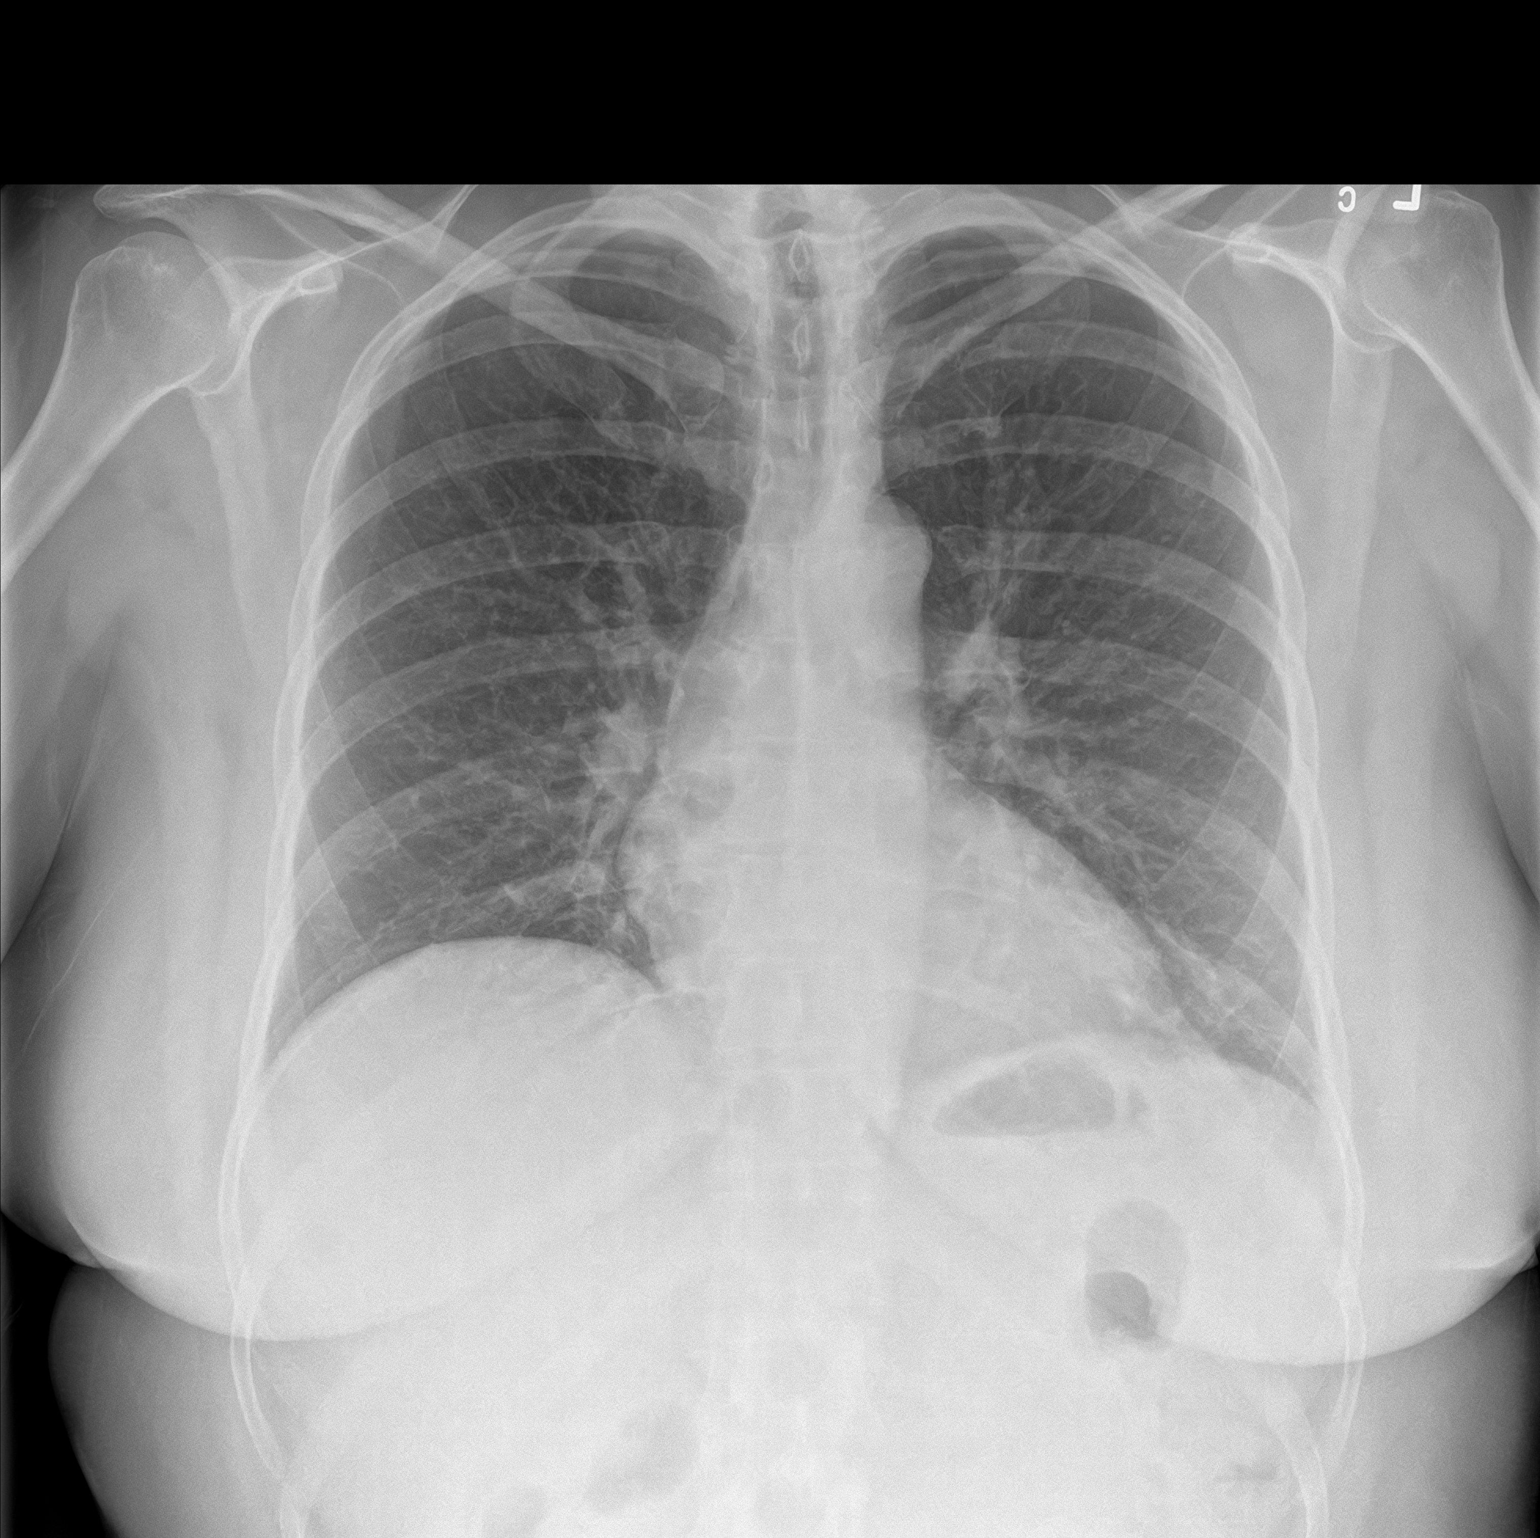
[im 2/2]
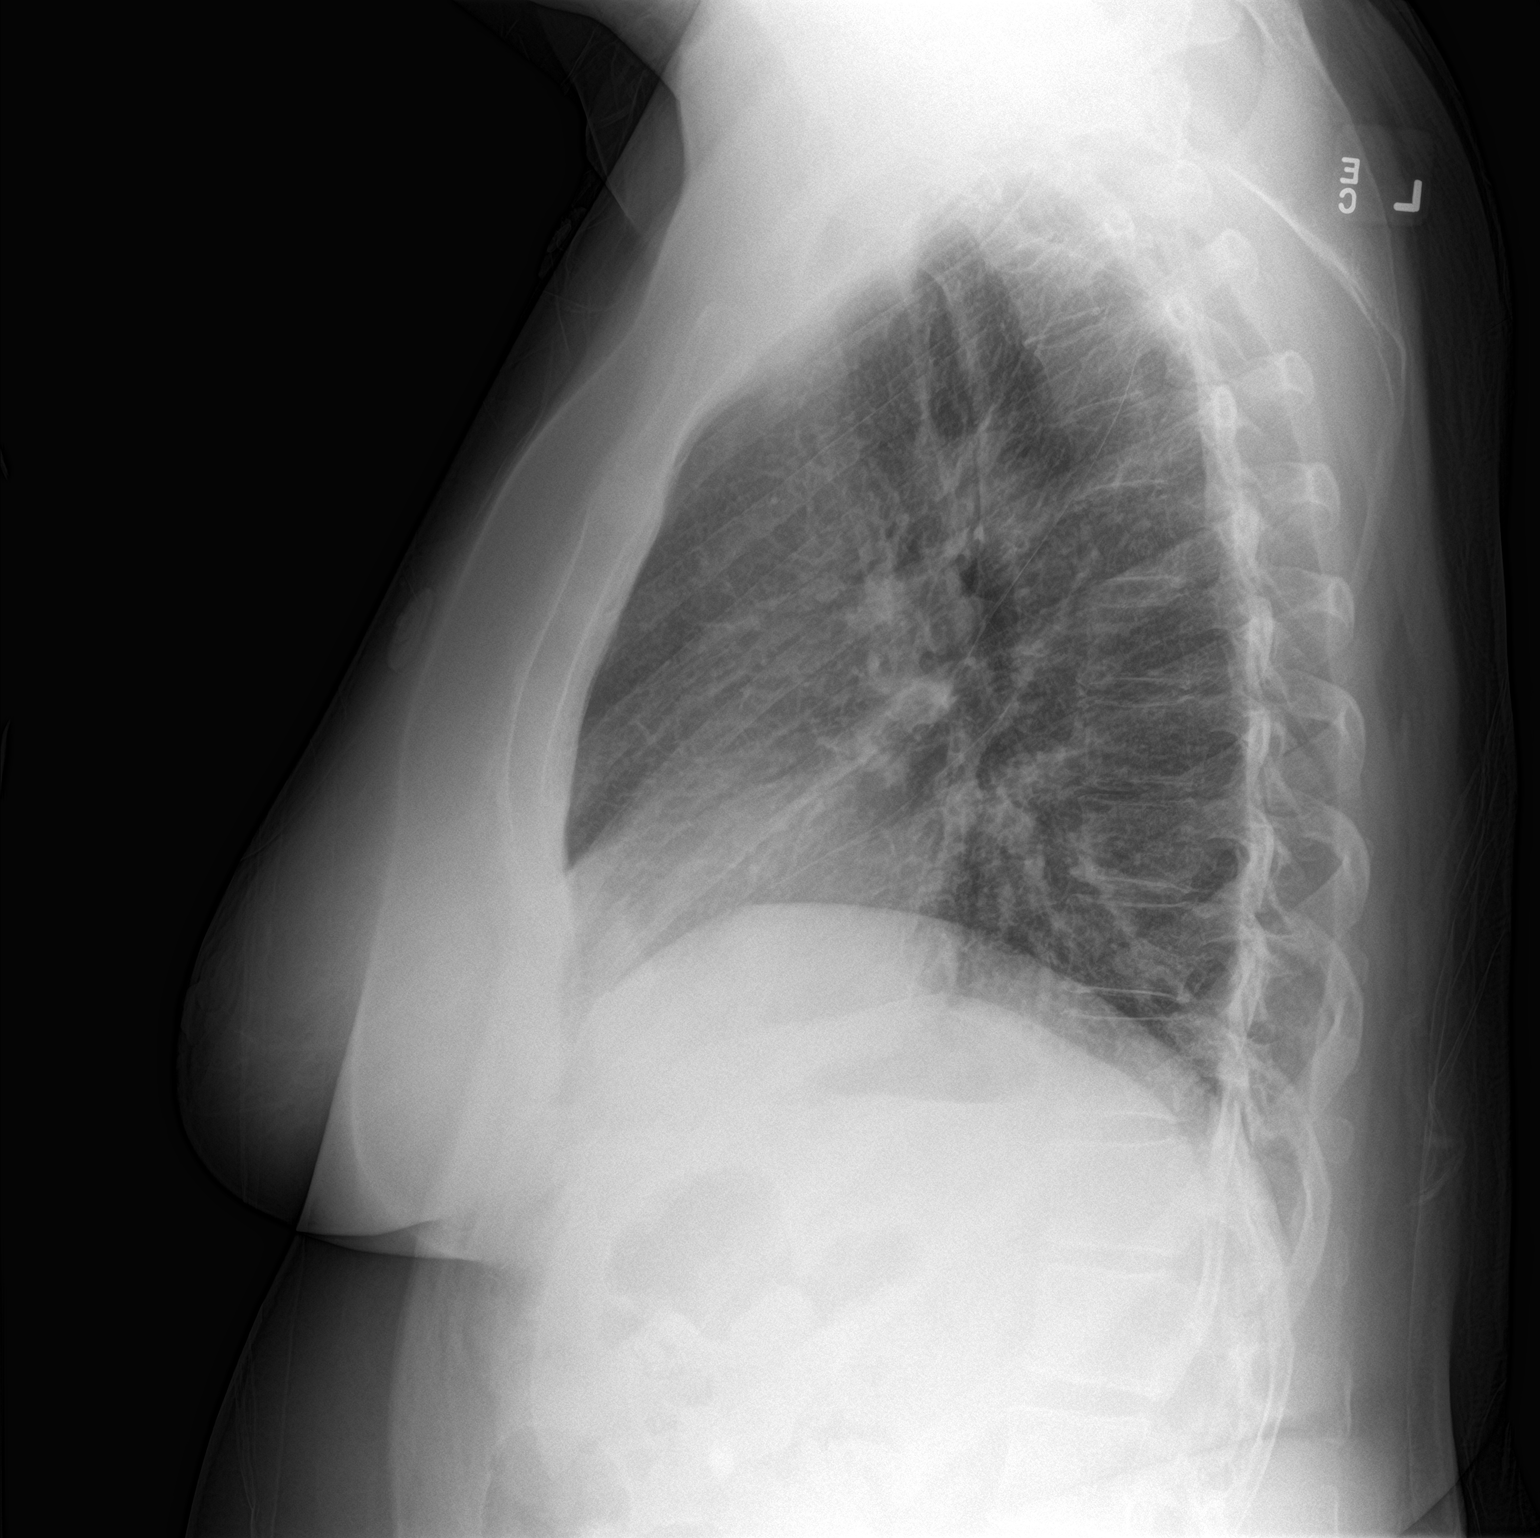

[2 of 2 positions shown; findings below may reference images not displayed]

FINDINGS: Heart size is normal. There is minimal patchy density in the left
lower lobe, consistent with early infiltrate. No pulmonary edema.
IMPRESSION: Minimal left lower lobe infiltrate. Followup PA and lateral chest
X-ray is recommended in 3-4 weeks following trial of antibiotic
therapy to ensure resolution and exclude underlying malignancy.

## 2018-05-17 ENCOUNTER — Other Ambulatory Visit
Admission: RE | Admit: 2018-05-17 | Discharge: 2018-05-17 | Disposition: A | Discharge status: Home / Self Care | Payer: PRIVATE HEALTH INSURANCE | Source: Ambulatory Visit | Attending: Internal Medicine | Admitting: Internal Medicine

## 2018-05-17 ENCOUNTER — Other Ambulatory Visit: Payer: Self-pay

## 2018-05-17 DIAGNOSIS — Z1159 Encounter for screening for other viral diseases: Secondary | ICD-10-CM | POA: Diagnosis present

## 2018-05-18 LAB — NOVEL CORONAVIRUS, NAA (HOSP ORDER, SEND-OUT TO REF LAB; TAT 18-24 HRS): SARS-CoV-2, NAA: NOT DETECTED

## 2018-05-22 ENCOUNTER — Encounter: Payer: Self-pay | Admitting: *Deleted

## 2018-05-22 ENCOUNTER — Other Ambulatory Visit: Payer: Self-pay

## 2018-05-22 ENCOUNTER — Ambulatory Visit
Admission: RE | Admit: 2018-05-22 | Discharge: 2018-05-22 | Disposition: A | Discharge status: Home / Self Care | Payer: PRIVATE HEALTH INSURANCE | Attending: Internal Medicine | Admitting: Internal Medicine

## 2018-05-22 ENCOUNTER — Ambulatory Visit
Admission: RE | Admit: 2018-05-22 | Discharge: 2018-05-22 | Disposition: A | Discharge status: Home / Self Care | Payer: PRIVATE HEALTH INSURANCE | Source: Home / Self Care | Attending: Internal Medicine | Admitting: Internal Medicine

## 2018-05-22 ENCOUNTER — Encounter
Admission: RE | Disposition: A | Discharge status: Home / Self Care | Payer: Self-pay | Source: Home / Self Care | Attending: Internal Medicine

## 2018-05-22 DIAGNOSIS — I10 Essential (primary) hypertension: Secondary | ICD-10-CM | POA: Diagnosis not present

## 2018-05-22 DIAGNOSIS — I639 Cerebral infarction, unspecified: Secondary | ICD-10-CM | POA: Diagnosis present

## 2018-05-22 DIAGNOSIS — R51 Headache: Secondary | ICD-10-CM | POA: Insufficient documentation

## 2018-05-22 HISTORY — PX: TEE WITHOUT CARDIOVERSION: SHX5443

## 2018-05-22 SURGERY — ECHOCARDIOGRAM, TRANSESOPHAGEAL
Anesthesia: Moderate Sedation

## 2018-05-22 MED ORDER — MIDAZOLAM HCL 5 MG/5ML IJ SOLN
INTRAMUSCULAR | Status: AC
  Filled 2018-05-22: qty 10

## 2018-05-22 MED ORDER — LIDOCAINE VISCOUS HCL 2 % MT SOLN
OROMUCOSAL | Status: AC
  Administered 2018-05-22: 15 mL
  Filled 2018-05-22: qty 15

## 2018-05-22 MED ORDER — SODIUM CHLORIDE 0.9 % IV SOLN: INTRAVENOUS | Status: DC

## 2018-05-22 MED ORDER — BUTAMBEN-TETRACAINE-BENZOCAINE 2-2-14 % EX AERO
INHALATION_SPRAY | CUTANEOUS | Status: AC
  Administered 2018-05-22: 3
  Filled 2018-05-22: qty 5

## 2018-05-22 MED ORDER — MIDAZOLAM HCL 5 MG/5ML IJ SOLN
INTRAMUSCULAR | Status: AC | PRN
  Administered 2018-05-22: 1 mg via INTRAVENOUS
  Administered 2018-05-22: 2 mg via INTRAVENOUS

## 2018-05-22 MED ORDER — FENTANYL CITRATE (PF) 100 MCG/2ML IJ SOLN
INTRAMUSCULAR | Status: AC
  Filled 2018-05-22: qty 4

## 2018-05-22 MED ORDER — FENTANYL CITRATE (PF) 100 MCG/2ML IJ SOLN
INTRAMUSCULAR | Status: AC | PRN
  Administered 2018-05-22: 50 ug via INTRAVENOUS

## 2018-05-22 NOTE — Progress Notes (Signed)
*  PRELIMINARY RESULTS* Echocardiogram Echocardiogram Transesophageal has been performed.  Brandi Rose 05/22/2018, 8:25 AM

## 2018-05-22 NOTE — CV Procedure (Signed)
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 The patient and or family understand the risks of the procedure including the rare risks of death, stroke, heart attack, esophogeal perforation, sore throat, and reaction to medications given.  Moderate sedation During this procedure the patient has received Versed 3 milligrams and fentanyl 50 micrograms to achieve appropriate moderate sedation.  The patient had continued monitoring of heart rate, oxygenation, blood pressure, respiratory rate, and extent of signs of sedation throughout the entire procedure.  The patient received this moderate sedation over a period of 15 minutes.  Both the nursing staff and I were present during the procedure when the patient had moderate sedation for 100% of the time.  Treatment considerations  No further cardiac intervention due to no apparent cardiac source of embolus  For further details of transesophageal echocardiogram please refer to final report.  Signed,  Lamar Blinks M.D. [facility] 05/22/2018 8:52 AM

## 2018-05-23 ENCOUNTER — Encounter: Payer: Self-pay | Admitting: Internal Medicine

## 2018-06-12 ENCOUNTER — Other Ambulatory Visit: Payer: Self-pay | Admitting: Neurology

## 2018-06-12 DIAGNOSIS — G43119 Migraine with aura, intractable, without status migrainosus: Secondary | ICD-10-CM

## 2018-06-24 ENCOUNTER — Ambulatory Visit
Admission: RE | Admit: 2018-06-24 | Discharge: 2018-06-24 | Disposition: A | Discharge status: Home / Self Care | Payer: PRIVATE HEALTH INSURANCE | Source: Ambulatory Visit | Attending: Neurology | Admitting: Neurology

## 2018-06-24 ENCOUNTER — Other Ambulatory Visit: Payer: Self-pay

## 2018-06-24 DIAGNOSIS — G43119 Migraine with aura, intractable, without status migrainosus: Secondary | ICD-10-CM | POA: Insufficient documentation

## 2018-09-13 ENCOUNTER — Other Ambulatory Visit: Payer: PRIVATE HEALTH INSURANCE

## 2018-09-13 ENCOUNTER — Ambulatory Visit
Admission: RE | Admit: 2018-09-13 | Discharge: 2018-09-13 | Disposition: A | Discharge status: Home / Self Care | Payer: PRIVATE HEALTH INSURANCE | Source: Ambulatory Visit | Attending: Family Medicine | Admitting: Family Medicine

## 2018-09-13 DIAGNOSIS — N6489 Other specified disorders of breast: Secondary | ICD-10-CM | POA: Diagnosis present

## 2018-09-13 DIAGNOSIS — R928 Other abnormal and inconclusive findings on diagnostic imaging of breast: Secondary | ICD-10-CM | POA: Insufficient documentation

## 2018-09-16 ENCOUNTER — Other Ambulatory Visit: Payer: Self-pay | Admitting: Family Medicine

## 2018-09-16 DIAGNOSIS — R928 Other abnormal and inconclusive findings on diagnostic imaging of breast: Secondary | ICD-10-CM

## 2018-09-16 DIAGNOSIS — N6489 Other specified disorders of breast: Secondary | ICD-10-CM

## 2018-09-16 DIAGNOSIS — Z1231 Encounter for screening mammogram for malignant neoplasm of breast: Secondary | ICD-10-CM

## 2018-10-04 ENCOUNTER — Other Ambulatory Visit: Payer: PRIVATE HEALTH INSURANCE

## 2019-03-28 ENCOUNTER — Emergency Department: Payer: PRIVATE HEALTH INSURANCE

## 2019-03-28 ENCOUNTER — Other Ambulatory Visit: Payer: Self-pay

## 2019-03-28 ENCOUNTER — Emergency Department
Admission: EM | Admit: 2019-03-28 | Discharge: 2019-03-28 | Disposition: A | Discharge status: Home / Self Care | Payer: PRIVATE HEALTH INSURANCE | Attending: Emergency Medicine | Admitting: Emergency Medicine

## 2019-03-28 ENCOUNTER — Encounter: Payer: Self-pay | Admitting: Emergency Medicine

## 2019-03-28 DIAGNOSIS — Z5321 Procedure and treatment not carried out due to patient leaving prior to being seen by health care provider: Secondary | ICD-10-CM | POA: Insufficient documentation

## 2019-03-28 DIAGNOSIS — R079 Chest pain, unspecified: Secondary | ICD-10-CM | POA: Diagnosis not present

## 2019-03-28 LAB — BASIC METABOLIC PANEL
Anion gap: 9 (ref 5–15)
BUN: 17 mg/dL (ref 6–20)
CO2: 28 mmol/L (ref 22–32)
Calcium: 9.6 mg/dL (ref 8.9–10.3)
Chloride: 101 mmol/L (ref 98–111)
Creatinine, Ser: 0.84 mg/dL (ref 0.44–1.00)
GFR calc Af Amer: >60 mL/min (ref >60)
GFR calc non Af Amer: >60 mL/min (ref >60)
Glucose, Bld: 100 mg/dL — ABNORMAL HIGH (ref 70–99)
Potassium: 3.6 mmol/L (ref 3.5–5.1)
Sodium: 138 mmol/L (ref 135–145)

## 2019-03-28 LAB — CBC
HCT: 38.5 % (ref 36.0–46.0)
Hemoglobin: 12.7 g/dL (ref 12.0–15.0)
MCH: 28.9 pg (ref 26.0–34.0)
MCHC: 33 g/dL (ref 30.0–36.0)
MCV: 87.7 fL (ref 80.0–100.0)
Platelets: 383 10*3/uL (ref 150–400)
RBC: 4.39 MIL/uL (ref 3.87–5.11)
RDW: 13.1 % (ref 11.5–15.5)
WBC: 8.6 10*3/uL (ref 4.0–10.5)
nRBC: 0 % (ref 0.0–0.2)

## 2019-03-28 LAB — TROPONIN I (HIGH SENSITIVITY): Troponin I (High Sensitivity): 5 ng/L (ref <18)

## 2019-03-28 NOTE — ED Triage Notes (Signed)
Pt here today for chest pain to left chest radiating to arm.  Has had pain/palpitaionts on and off this week but today started going into arm.  No SHOB.  Has had some nausea after eating.  Unlabored.  Ambulatory.

## 2019-03-31 ENCOUNTER — Telehealth: Payer: Self-pay | Admitting: Emergency Medicine

## 2019-03-31 NOTE — Telephone Encounter (Signed)
Called patient due to lwot to inquire about condition and follow up plans.No answer and voicemail is full. °

## 2019-04-18 ENCOUNTER — Ambulatory Visit
Admission: RE | Admit: 2019-04-18 | Discharge: 2019-04-18 | Disposition: A | Discharge status: Home / Self Care | Payer: PRIVATE HEALTH INSURANCE | Source: Ambulatory Visit | Attending: Family Medicine | Admitting: Family Medicine

## 2019-04-18 DIAGNOSIS — R928 Other abnormal and inconclusive findings on diagnostic imaging of breast: Secondary | ICD-10-CM | POA: Insufficient documentation

## 2019-04-18 DIAGNOSIS — Z1231 Encounter for screening mammogram for malignant neoplasm of breast: Secondary | ICD-10-CM

## 2019-04-18 DIAGNOSIS — N6489 Other specified disorders of breast: Secondary | ICD-10-CM

## 2019-09-27 IMAGING — MR MRA HEAD WITHOUT CONTRAST
1 series · 19 of 48 positions shown · non-contrast
Comparison: Comparison made with prior head CT from 05/16/2013.

CLINICAL DATA: Initial evaluation for history of migraine.

EXAM:
MRA HEAD WITHOUT CONTRAST
TECHNIQUE: Angiographic images of the Circle of Willis were obtained using MRA
technique without intravenous contrast.

[Series 7: TOF · axial · 0.5mm · 0.41mm/px · z∈[-36,+60]mm · 19 of 205 slices shown]
[im 1/205]
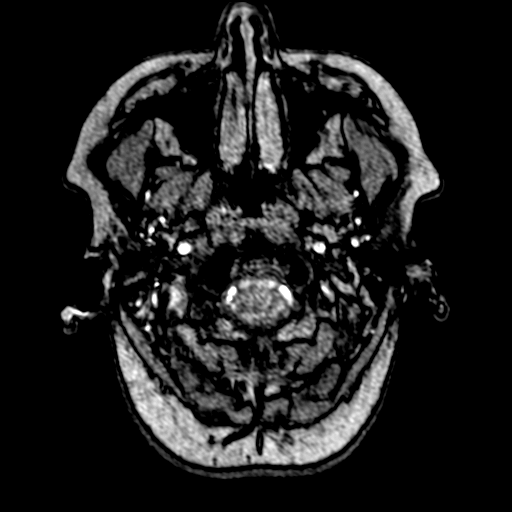
[im 5/205]
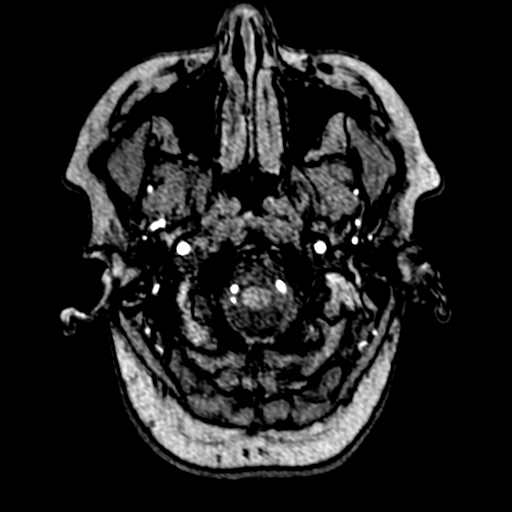
[im 9/205]
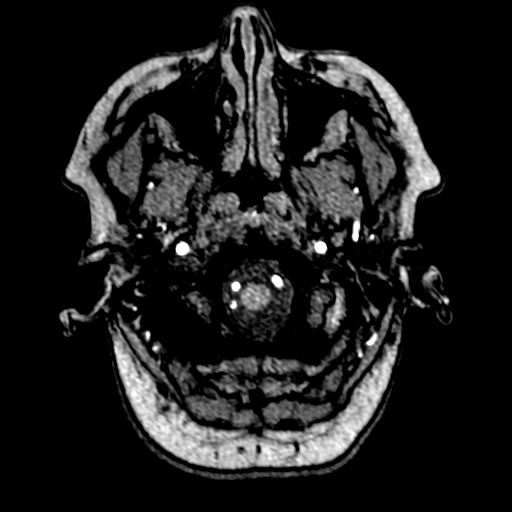
[im 14/205]
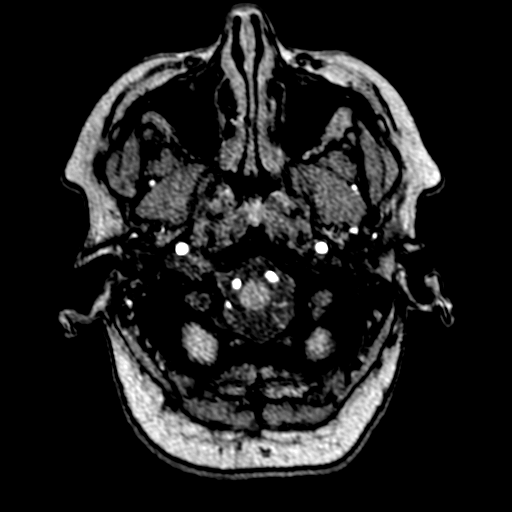
[im 18/205]
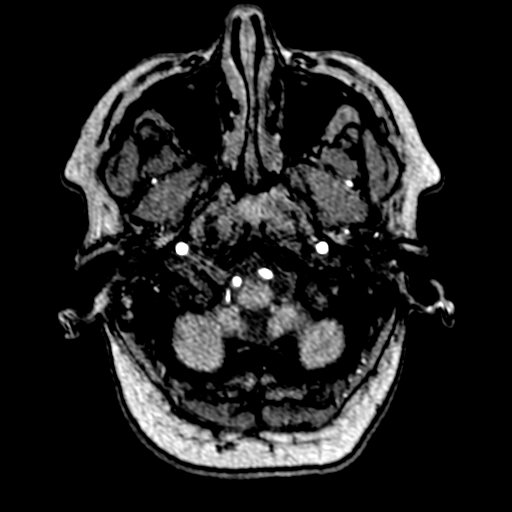
[im 22/205]
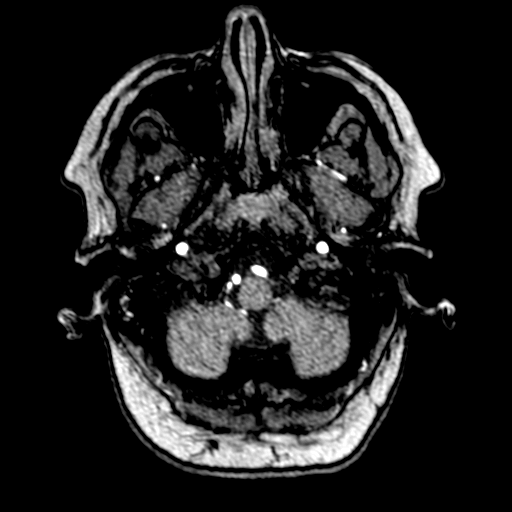
[im 27/205]
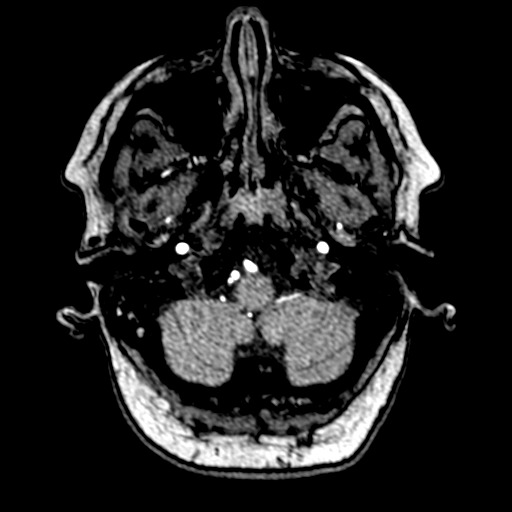
[im 31/205]
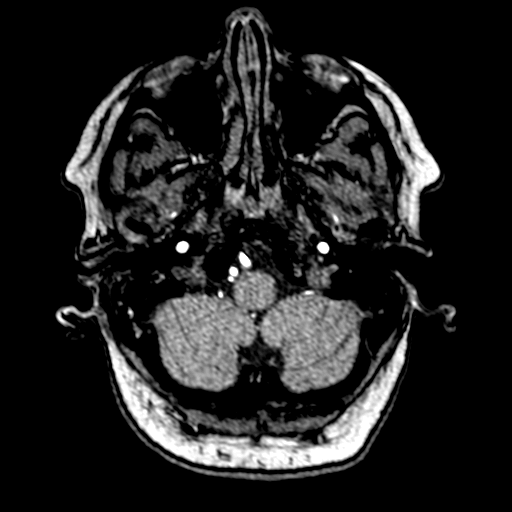
[im 35/205]
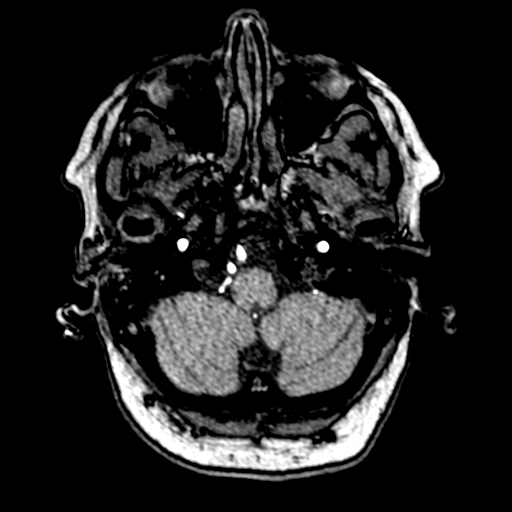
[im 40/205]
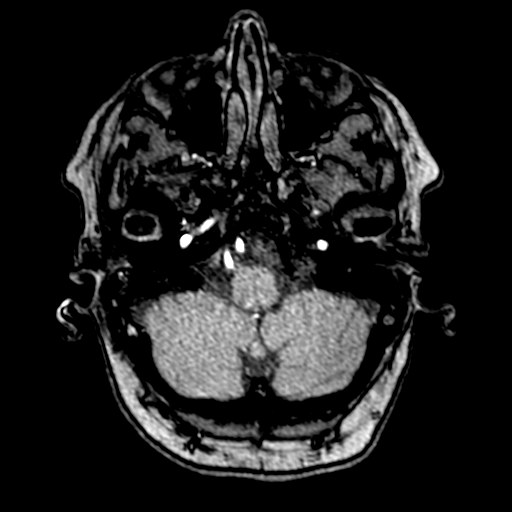
[im 44/205]
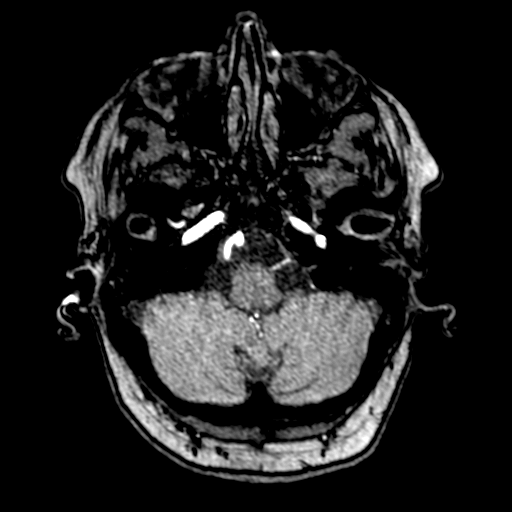
[im 66/205]
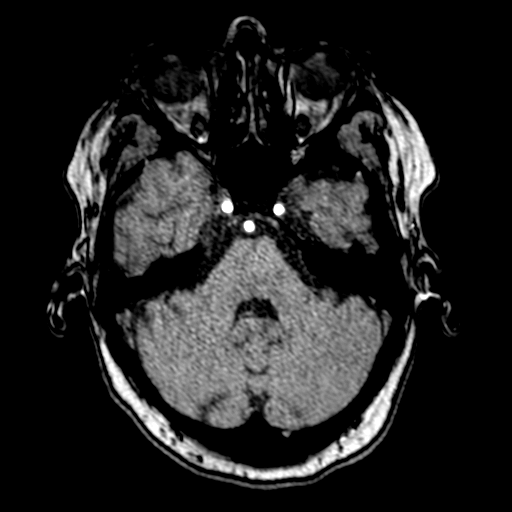
[im 92/205]
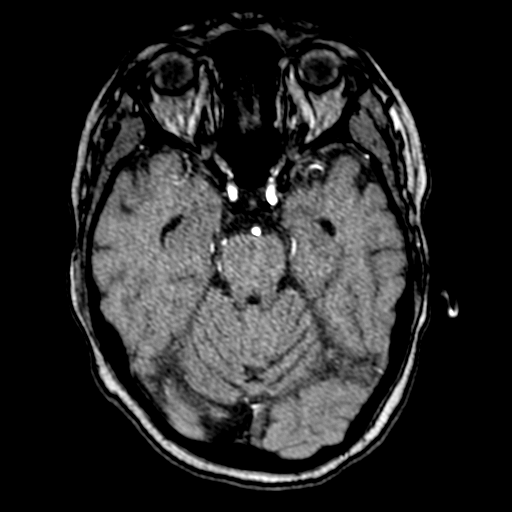
[im 105/205]
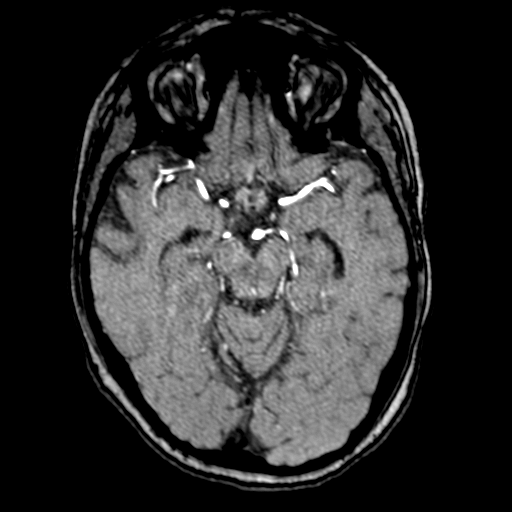
[im 118/205]
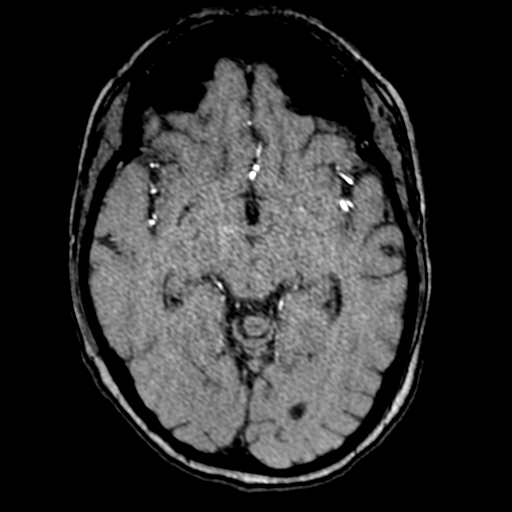
[im 144/205]
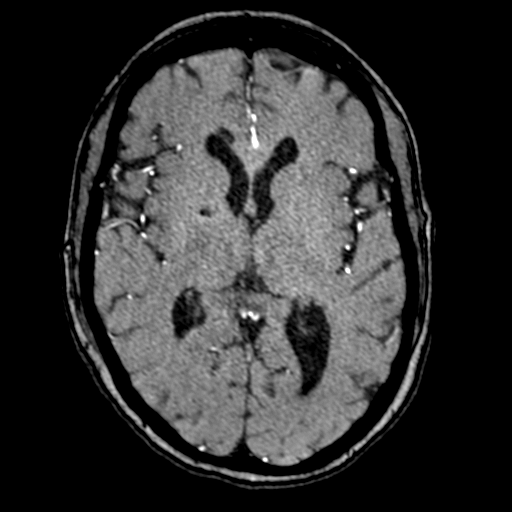
[im 170/205]
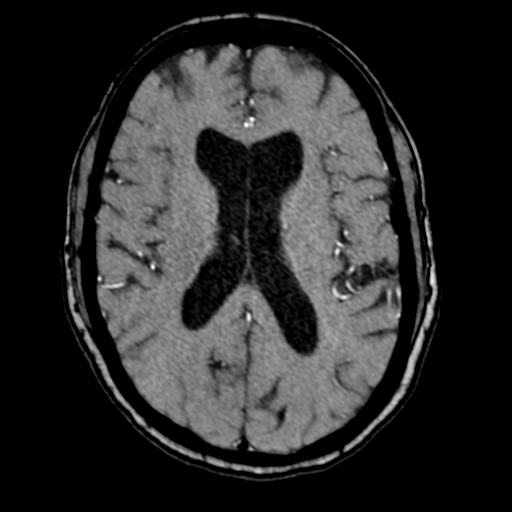
[im 174/205]
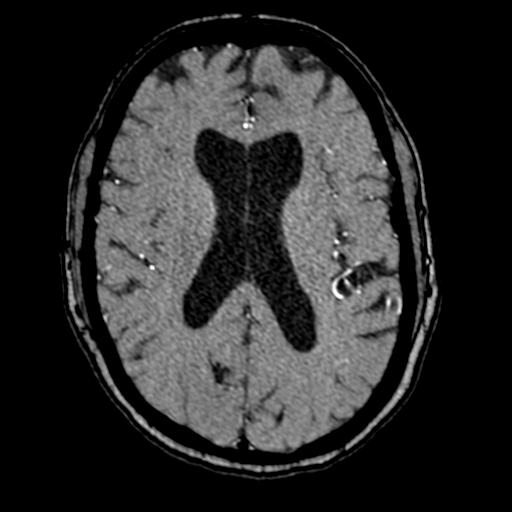
[im 196/205]
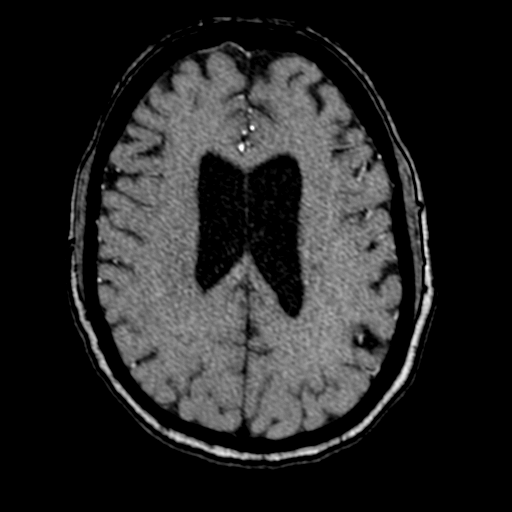

[19 of 48 positions shown; findings below may reference images not displayed]

FINDINGS: ANTERIOR CIRCULATION:

Distal cervical segments of the internal carotid arteries are patent
with symmetric antegrade flow. Petrous, cavernous, and supraclinoid
segments patent without hemodynamically significant stenosis. ICA
termini well perfused. A1 segments widely patent bilaterally. Normal
anterior communicating artery complex. Anterior cerebral arteries
widely patent to their distal aspects without stenosis. M1 segments
widely patent bilaterally. Normal MCA bifurcations. Distal MCA
branches well perfused and symmetric. Distal small vessel
atheromatous irregularity.

POSTERIOR CIRCULATION:

Visualized vertebral arteries widely patent to the vertebrobasilar
junction without stenosis. Left vertebral artery dominant. Posterior
inferior cerebral arteries not well evaluated on this exam. Basilar
widely patent to its distal aspect without stenosis. Superior
cerebral arteries patent bilaterally. Both of the posterior cerebral
arteries primarily supplied via the basilar. PCAs well perfused to
their distal aspects without hemodynamically significant stenosis.
Distal small vessel atheromatous irregularity.

No intracranial aneurysm or other vascular abnormality.
IMPRESSION: 1. Normal MRA appearance of the large and medium sized vessels of
the intracranial circulation. No large vessel occlusion,
hemodynamically significant stenosis, or other acute vascular
abnormality.
2. No intracranial aneurysm.
3. Mild distal small vessel atheromatous irregularity.

## 2019-10-31 ENCOUNTER — Other Ambulatory Visit: Payer: Self-pay | Admitting: Neurology

## 2019-10-31 DIAGNOSIS — R413 Other amnesia: Secondary | ICD-10-CM

## 2019-10-31 DIAGNOSIS — R42 Dizziness and giddiness: Secondary | ICD-10-CM

## 2019-11-26 ENCOUNTER — Ambulatory Visit
Admission: RE | Admit: 2019-11-26 | Discharge: 2019-11-26 | Disposition: A | Discharge status: Home / Self Care | Payer: PRIVATE HEALTH INSURANCE | Source: Ambulatory Visit | Attending: Neurology | Admitting: Neurology

## 2019-11-26 ENCOUNTER — Other Ambulatory Visit: Payer: Self-pay

## 2019-11-26 DIAGNOSIS — R413 Other amnesia: Secondary | ICD-10-CM | POA: Insufficient documentation

## 2019-11-26 DIAGNOSIS — R42 Dizziness and giddiness: Secondary | ICD-10-CM | POA: Diagnosis not present

## 2019-11-26 MED ORDER — GADOBUTROL 1 MMOL/ML IV SOLN
9.0000 mL | Freq: Once | INTRAVENOUS | Status: AC | PRN
  Administered 2019-11-26: 9 mL via INTRAVENOUS

## 2019-12-04 ENCOUNTER — Other Ambulatory Visit: Payer: Self-pay | Admitting: Neurology

## 2019-12-04 DIAGNOSIS — R42 Dizziness and giddiness: Secondary | ICD-10-CM

## 2019-12-10 ENCOUNTER — Ambulatory Visit: Payer: PRIVATE HEALTH INSURANCE

## 2019-12-11 ENCOUNTER — Ambulatory Visit
Admission: RE | Admit: 2019-12-11 | Discharge: 2019-12-11 | Disposition: A | Discharge status: Home / Self Care | Payer: PRIVATE HEALTH INSURANCE | Source: Ambulatory Visit | Attending: Neurology | Admitting: Neurology

## 2019-12-11 ENCOUNTER — Other Ambulatory Visit: Payer: Self-pay

## 2019-12-11 DIAGNOSIS — R42 Dizziness and giddiness: Secondary | ICD-10-CM | POA: Diagnosis present

## 2019-12-11 MED ORDER — GADOBUTROL 1 MMOL/ML IV SOLN
9.0000 mL | Freq: Once | INTRAVENOUS | Status: AC | PRN
  Administered 2019-12-11: 9 mL via INTRAVENOUS

## 2020-12-31 ENCOUNTER — Other Ambulatory Visit: Payer: Self-pay

## 2020-12-31 ENCOUNTER — Emergency Department
Admission: EM | Admit: 2020-12-31 | Discharge: 2021-01-01 | Disposition: A | Discharge status: Home / Self Care | Payer: PRIVATE HEALTH INSURANCE | Attending: Emergency Medicine | Admitting: Emergency Medicine

## 2020-12-31 DIAGNOSIS — R109 Unspecified abdominal pain: Secondary | ICD-10-CM | POA: Diagnosis present

## 2020-12-31 DIAGNOSIS — Z5321 Procedure and treatment not carried out due to patient leaving prior to being seen by health care provider: Secondary | ICD-10-CM | POA: Insufficient documentation

## 2020-12-31 LAB — COMPREHENSIVE METABOLIC PANEL
ALT: 25 U/L (ref 0–44)
AST: 18 U/L (ref 15–41)
Albumin: 3.1 g/dL — ABNORMAL LOW (ref 3.5–5.0)
Alkaline Phosphatase: 96 U/L (ref 38–126)
Anion gap: 11 (ref 5–15)
BUN: 27 mg/dL — ABNORMAL HIGH (ref 6–20)
CO2: 24 mmol/L (ref 22–32)
Calcium: 9.6 mg/dL (ref 8.9–10.3)
Chloride: 99 mmol/L (ref 98–111)
Creatinine, Ser: 1.39 mg/dL — ABNORMAL HIGH (ref 0.44–1.00)
GFR, Estimated: 45 mL/min — ABNORMAL LOW (ref >60)
Glucose, Bld: 103 mg/dL — ABNORMAL HIGH (ref 70–99)
Potassium: 4 mmol/L (ref 3.5–5.1)
Sodium: 134 mmol/L — ABNORMAL LOW (ref 135–145)
Total Bilirubin: 1.1 mg/dL (ref 0.3–1.2)
Total Protein: 8.5 g/dL — ABNORMAL HIGH (ref 6.5–8.1)

## 2020-12-31 LAB — CBC
HCT: 38.7 % (ref 36.0–46.0)
Hemoglobin: 12 g/dL (ref 12.0–15.0)
MCH: 27 pg (ref 26.0–34.0)
MCHC: 31 g/dL (ref 30.0–36.0)
MCV: 87.2 fL (ref 80.0–100.0)
Platelets: 687 10*3/uL — ABNORMAL HIGH (ref 150–400)
RBC: 4.44 MIL/uL (ref 3.87–5.11)
RDW: 15.2 % (ref 11.5–15.5)
WBC: 26.9 10*3/uL — ABNORMAL HIGH (ref 4.0–10.5)
nRBC: 0 % (ref 0.0–0.2)

## 2020-12-31 LAB — LIPASE, BLOOD: Lipase: 56 U/L — ABNORMAL HIGH (ref 11–51)

## 2020-12-31 NOTE — ED Triage Notes (Signed)
Pt comes with c/o abdominal pain for few days.

## 2020-12-31 NOTE — ED Notes (Addendum)
No answer in lobby for repeat vitals x3 °

## 2021-03-07 DIAGNOSIS — Z90710 Acquired absence of both cervix and uterus: Secondary | ICD-10-CM | POA: Diagnosis not present

## 2021-03-07 DIAGNOSIS — M79645 Pain in left finger(s): Secondary | ICD-10-CM | POA: Diagnosis not present

## 2021-03-07 DIAGNOSIS — Z0000 Encounter for general adult medical examination without abnormal findings: Secondary | ICD-10-CM | POA: Diagnosis not present

## 2021-03-07 DIAGNOSIS — E7800 Pure hypercholesterolemia, unspecified: Secondary | ICD-10-CM | POA: Diagnosis not present

## 2021-03-07 DIAGNOSIS — I10 Essential (primary) hypertension: Secondary | ICD-10-CM | POA: Diagnosis not present

## 2021-03-07 DIAGNOSIS — R739 Hyperglycemia, unspecified: Secondary | ICD-10-CM | POA: Diagnosis not present

## 2021-03-07 DIAGNOSIS — Z1331 Encounter for screening for depression: Secondary | ICD-10-CM | POA: Diagnosis not present

## 2021-03-11 DIAGNOSIS — M7989 Other specified soft tissue disorders: Secondary | ICD-10-CM | POA: Diagnosis not present

## 2021-03-11 DIAGNOSIS — W548XXA Other contact with dog, initial encounter: Secondary | ICD-10-CM | POA: Diagnosis not present

## 2021-03-11 DIAGNOSIS — M0579 Rheumatoid arthritis with rheumatoid factor of multiple sites without organ or systems involvement: Secondary | ICD-10-CM | POA: Diagnosis not present

## 2021-03-11 DIAGNOSIS — S60512A Abrasion of left hand, initial encounter: Secondary | ICD-10-CM | POA: Diagnosis not present

## 2021-03-11 DIAGNOSIS — Z79899 Other long term (current) drug therapy: Secondary | ICD-10-CM | POA: Diagnosis not present

## 2021-04-22 DIAGNOSIS — M7701 Medial epicondylitis, right elbow: Secondary | ICD-10-CM | POA: Diagnosis not present

## 2021-04-22 DIAGNOSIS — Z7960 Long term (current) use of unspecified immunomodulators and immunosuppressants: Secondary | ICD-10-CM | POA: Diagnosis not present

## 2021-04-22 DIAGNOSIS — M0579 Rheumatoid arthritis with rheumatoid factor of multiple sites without organ or systems involvement: Secondary | ICD-10-CM | POA: Diagnosis not present

## 2021-10-21 ENCOUNTER — Emergency Department
Admission: EM | Admit: 2021-10-21 | Discharge: 2021-10-21 | Disposition: A | Discharge status: Home / Self Care | Payer: 59 | Attending: Emergency Medicine | Admitting: Emergency Medicine

## 2021-10-21 ENCOUNTER — Emergency Department: Payer: 59

## 2021-10-21 DIAGNOSIS — W010XXA Fall on same level from slipping, tripping and stumbling without subsequent striking against object, initial encounter: Secondary | ICD-10-CM | POA: Insufficient documentation

## 2021-10-21 DIAGNOSIS — Y93K9 Activity, other involving animal care: Secondary | ICD-10-CM | POA: Diagnosis not present

## 2021-10-21 DIAGNOSIS — S62111A Displaced fracture of triquetrum [cuneiform] bone, right wrist, initial encounter for closed fracture: Secondary | ICD-10-CM

## 2021-10-21 DIAGNOSIS — M25531 Pain in right wrist: Secondary | ICD-10-CM | POA: Insufficient documentation

## 2021-10-21 DIAGNOSIS — S6991XA Unspecified injury of right wrist, hand and finger(s), initial encounter: Secondary | ICD-10-CM | POA: Diagnosis present

## 2021-10-21 NOTE — ED Triage Notes (Signed)
Patient fell on her wrist when she was trying to break up a fight between her cat and dog

## 2021-10-21 NOTE — ED Provider Notes (Signed)
[facility] Provider Note    Event Date/Time   First MD Initiated Contact with Patient 10/21/21 1732     (approximate)   History   Wrist Pain (Patient fell on her wrist when she was trying to break up a fight between her cat and dog)   HPI  Brandi Rose is a 56 y.o. female who presents today for evaluation of right wrist pain.  Patient reports that she had a trip and fall onto an outstretched right hand because she was trying to break up a fight between her cat and her dog.  She reports that she felt pain immediately.  She denies head strike or LOC.  She denies paresthesias.  No other injury sustained.     Physical Exam   Triage Vital Signs: ED Triage Vitals  Enc Vitals Group     BP 10/21/21 1650 (!) 153/74     Pulse Rate 10/21/21 1650 85     Resp 10/21/21 1650 20     Temp 10/21/21 1650 99.1 F (37.3 C)     Temp Source 10/21/21 1650 Oral     SpO2 10/21/21 1650 97 %     Weight 10/21/21 1649 238 lb (108 kg)     Height 10/21/21 1649 5\' 7"  (1.702 m)     Head Circumference --      Peak Flow --      Pain Score 10/21/21 1650 8     Pain Loc --      Pain Edu? --      Excl. in [place]? --     Most recent vital signs: Vitals:   10/21/21 1650  BP: (!) 153/74  Pulse: 85  Resp: 20  Temp: 99.1 F (37.3 C)  SpO2: 97%    Physical Exam Vitals and nursing note reviewed.  Constitutional:      General: Awake and alert. No acute distress.    Appearance: Normal appearance. The patient is overweight.  HENT:     Head: Normocephalic and atraumatic.     Mouth: Mucous membranes are moist.  Eyes:     General: PERRL. Normal EOMs        Right eye: No discharge.        Left eye: No discharge.     Conjunctiva/sclera: Conjunctivae normal.  Cardiovascular:     Rate and Rhythm: Normal rate and regular rhythm.     Pulses: Normal pulses.  Pulmonary:     Effort: Pulmonary effort is normal. No respiratory distress.  Abdominal:     Abdomen is soft. There is no  abdominal tenderness. Musculoskeletal:        General: No swelling. Normal range of motion.     Cervical back: Normal range of motion and neck supple.  Right upper extremity: Tenderness and swelling to the dorsum of her right wrist and hand.  No open wounds.  No tenderness along the forearm, elbow, shoulder, clavicle, AC joint.  Normal radial pulse.  Normal intrinsic muscle function of the hand. Skin:    General: Skin is warm and dry.     Capillary Refill: Capillary refill takes less than 2 seconds.     Findings: No rash.  Neurological:     Mental Status: The patient is awake and alert.      ED Results / Procedures / Treatments   Labs (all labs ordered are listed, but only abnormal results are displayed) Labs Reviewed - No data to display   EKG  RADIOLOGY I independently reviewed and interpreted imaging and agree with radiologists findings.     PROCEDURES:  Critical Care performed:   .Splint Application  Date/Time: 10/21/2021 7:07 PM  Performed by: Jackelyn Hoehn, PA-C Authorized by: Jackelyn Hoehn, PA-C   Consent:    Consent obtained:  Verbal   Consent given by:  Patient   Risks, benefits, and alternatives were discussed: yes     Risks discussed:  Discoloration, numbness, pain and swelling   Alternatives discussed:  No treatment Universal protocol:    Procedure explained and questions answered to patient or proxy's satisfaction: yes     Relevant documents present and verified: yes     Test results available: yes     Imaging studies available: yes     Required blood products, implants, devices, and special equipment available: yes     Site/side marked: yes     Immediately prior to procedure a time out was called: yes     Patient identity confirmed:  Verbally with patient Pre-procedure details:    Distal neurologic exam:  Normal   Distal perfusion: distal pulses strong and brisk capillary refill   Procedure details:    Location:  Hand   Hand location:  R  hand   Strapping: no     Splint type:  Sugar tong   Supplies:  Sling, cotton padding and elastic bandage (Ortho-Glass)   Attestation: Splint applied and adjusted personally by me   Post-procedure details:    Distal neurologic exam:  Normal   Distal perfusion: distal pulses strong and brisk capillary refill     Procedure completion:  Tolerated well, no immediate complications   Post-procedure imaging: not applicable      MEDICATIONS ORDERED IN ED: Medications - No data to display   IMPRESSION / MDM / ASSESSMENT AND PLAN / ED COURSE  I reviewed the triage vital signs and the nursing notes.   Differential diagnosis includes, but is not limited to, fracture, dislocation, contusion.  Patient is neurovascularly intact.  X-ray obtained at triage is suggestive of a possible triquetrum fracture.  No signs or symptoms of compartment syndrome, compartments are soft and compressible throughout, normal distal pulses, normal sensation, no pain out of proportion.  Normal temperature.  Patient was placed in a sugar-tong splint.  She was instructed to follow-up with orthopedics.  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  We discussed splint care, return precautions, and the importance of close outpatient follow-up.  Also advised rest, ice, elevation.  Patient understands and agrees with plan.  She was discharged in stable condition.   Patient's presentation is most consistent with acute complicated illness / injury requiring diagnostic workup.      FINAL CLINICAL IMPRESSION(S) / ED DIAGNOSES   Final diagnoses:  Closed displaced fracture of triquetrum of right wrist, initial encounter     Rx / DC Orders   ED Discharge Orders     None        Note:  This document was prepared using Dragon voice recognition software and may include unintentional  dictation errors.   Keturah Shavers 10/21/21 Prentice Docker    Jene Every, MD 10/21/21 [phone]

## 2021-10-21 NOTE — Discharge Instructions (Signed)
Keep your splint clean and dry. Call orthopedics to arrange your outpatient follow up. Rest, ice, elevate your arm. It was a pleasure caring for you.

## 2021-10-21 NOTE — ED Provider Triage Note (Signed)
Emergency Medicine Provider Triage Evaluation Note  Brandi Rose, a 56 y.o. female  was evaluated in triage.  Pt complains of right wrist pain.  She was apparently chasing her dog, and fell on outstretched right arm.  She presents to the ED with pain and disability to the right wrist has been progressive since this morning.  She denies any injury at this time.  Review of Systems  Positive: Right wrist pain and disability Negative: LOC  Physical Exam  BP (!) 153/74   Pulse 85   Temp 99.1 F (37.3 C) (Oral)   Resp 20   Ht 5\' 7"  (1.702 m)   Wt 108 kg   SpO2 97%   BMI 37.28 kg/m  Gen:   Awake, no distress  NAD Resp:  Normal effort CTA MSK:   Moves extremities without difficulty.  Right wrist without obvious deformity or dislocation.  Soft tissue swelling is appreciated.  Normal composite fist noted. Other:    Medical Decision Making  Medically screening exam initiated at 5:02 PM.  Appropriate orders placed.  Brandi Rose was informed that the remainder of the evaluation will be completed by another provider, this initial triage assessment does not replace that evaluation, and the importance of remaining in the ED until their evaluation is complete.  Patient to the ED for evaluation of right wrist pain after a [place] injury.   Melvenia Needles, PA-C 10/21/21 1739

## 2021-10-28 ENCOUNTER — Other Ambulatory Visit: Payer: Self-pay | Admitting: Orthopedic Surgery

## 2021-10-28 DIAGNOSIS — S66911A Strain of unspecified muscle, fascia and tendon at wrist and hand level, right hand, initial encounter: Secondary | ICD-10-CM

## 2021-10-31 ENCOUNTER — Ambulatory Visit: Payer: 59

## 2022-01-09 ENCOUNTER — Ambulatory Visit
Admission: EM | Admit: 2022-01-09 | Discharge: 2022-01-09 | Disposition: A | Discharge status: Home / Self Care | Payer: 59

## 2022-01-09 DIAGNOSIS — M069 Rheumatoid arthritis, unspecified: Secondary | ICD-10-CM

## 2022-01-09 MED ORDER — PREDNISONE 10 MG (21) PO TBPK: ORAL_TABLET | ORAL | 0 refills | Status: DC

## 2022-01-09 MED ORDER — HYDROCODONE-ACETAMINOPHEN 5-325 MG PO TABS: 1.0000 | ORAL_TABLET | Freq: Four times a day (QID) | ORAL | 0 refills | Status: DC | PRN

## 2022-01-09 NOTE — ED Provider Notes (Signed)
[facility]    CSN: [id number] Arrival date & time: 01/09/22  7062      History   Chief Complaint Chief Complaint  Patient presents with   Hand Pain    HPI Brandi Rose is a 57 y.o. female.   HPI  57 year old female here for evaluation of RA flare.  The patient reports that she is experiencing a flare of her rheumatoid arthritis in both of her hands.  Her right hand is hurting more than her left.  She does endorse some numbness to her right middle and index fingers, this is typical with her RA flare.  She also has carpal tunnel in both hands so that may be contributing.  She takes methotrexate weekly and Plaquenil twice daily and typically has good control.  That her current flare has been going on for the past 4 days and that she has been using Norco to help with the pain but she is currently out.  She is requesting a refill.  Treated with prednisone when she has her RA flares.  Past Medical History:  Diagnosis Date   Asthma    GERD (gastroesophageal reflux disease)    Headache    History of IBS    Hypertension    Insomnia    RLS (restless legs syndrome)     There are no problems to display for this patient.   Past Surgical History:  Procedure Laterality Date   ABDOMINAL HYSTERECTOMY  2001   APPENDECTOMY     ESOPHAGOGASTRODUODENOSCOPY (EGD) WITH PROPOFOL N/A 04/13/2016   Procedure: ESOPHAGOGASTRODUODENOSCOPY (EGD) WITH PROPOFOL;  Surgeon: Christena Deem, MD;  Location: [facility] ENDOSCOPY;  Service: Endoscopy;  Laterality: N/A;   KNEE ARTHROSCOPY     TEE WITHOUT CARDIOVERSION N/A 05/22/2018   Procedure: TRANSESOPHAGEAL ECHOCARDIOGRAM (TEE);  Surgeon: Lamar Blinks, MD;  Location: [facility] ORS;  Service: Cardiovascular;  Laterality: N/A;   TONSILLECTOMY      OB History   No obstetric history on file.      Home Medications    Prior to Admission medications   Medication Sig Start Date End Date Taking? Authorizing Provider  ALBUTEROL IN Inhale 2  Inhalers into the lungs 4 (four) times daily. 2 inhalations into the lungs every 4 hours as needed for wheezing   Yes [provider]  ARIPiprazole (ABILIFY) 5 MG tablet Take 5 mg by mouth daily.   Yes [provider]  aspirin 81 MG chewable tablet Chew by mouth. 09/02/21 03/01/22 Yes [provider]  fluticasone (FLONASE) 50 MCG/ACT nasal spray Place 2 sprays into both nostrils daily.   Yes [provider]  HYDROcodone-acetaminophen (NORCO/VICODIN) 5-325 MG tablet Take 1-2 tablets by mouth every 6 (six) hours as needed. Not more than 6 tabs in 24 hours. 01/09/22  Yes Becky Augusta, NP  lisinopril-hydrochlorothiazide (PRINZIDE,ZESTORETIC) 20-12.5 MG tablet Take 1 tablet by mouth daily.   Yes [provider]  methotrexate (RHEUMATREX) 2.5 MG tablet Take by mouth. 11/18/21  Yes [provider]  predniSONE (STERAPRED UNI-PAK 21 TAB) 10 MG (21) TBPK tablet Take 6 tablets on day 1, 5 tablets day 2, 4 tablets day 3, 3 tablets day 4, 2 tablets day 5, 1 tablet day 6 01/09/22  Yes Becky Augusta, NP  UBRELVY 100 MG TABS TAKE 1 TABLET BY MOUTH AS DIRECTED. MAY REPEAT 2ND DOSE IN 2 HOURS. NO MORE THAN 2 DOSES PER DAY 10/07/19  Yes [provider]  Vilazodone HCl (VIIBRYD) 10 MG TABS Take by mouth.  12/12/21  Yes [provider]  atorvastatin (LIPITOR) 40 MG tablet Take 40 mg by mouth daily.    [provider]  buPROPion (WELLBUTRIN XL) 300 MG 24 hr tablet Take 300 mg by mouth daily.    [provider]  clonazePAM (KLONOPIN) 0.5 MG tablet Take by mouth.    [provider]  fluticasone (VERAMYST) 27.5 MCG/SPRAY nasal spray Place 2 sprays into the nose daily.    [provider]  gabapentin (NEURONTIN) 300 MG capsule Take 600 mg by mouth at bedtime. 2 capsules (600mg  total) by mouth nightly    [provider]  hydroxychloroquine (PLAQUENIL) 200 MG tablet Take 200 mg by mouth 2 (two) times daily.    [provider]  lidocaine (XYLOCAINE) 2 % solution Use as directed 20 mLs in the mouth or throat every 4 (four) hours as needed for mouth pain. Patient not taking: Reported on 05/22/2018 01/06/17   Merlyn Lot, MD  memantine (NAMENDA) 5 MG tablet Take 5 mg by mouth 2 (two) times daily.    [provider]  nortriptyline (PAMELOR) 50 MG capsule Take 50 mg by mouth at bedtime.    [provider]  pantoprazole (PROTONIX) 40 MG tablet Take 1 tablet (40 mg total) by mouth daily. 01/06/17 05/22/18  Merlyn Lot, MD  SUMAtriptan (IMITREX) 50 MG tablet Take 50 mg by mouth once. May repeat in 2 hours if headache persists or recurs.    [provider]  valACYclovir (VALTREX) 1000 MG tablet Take 1,000 mg by mouth.    [provider]    Family History Family History  Problem Relation Age of Onset   Breast cancer Paternal Grandmother 76    Social History Social History   Tobacco Use   Smoking status: Never   Smokeless tobacco: Never  Substance Use Topics   Alcohol use: No   Drug use: No     Allergies   Bupropion, Donepezil, Latex, Sulfa antibiotics, Vicodin [hydrocodone-acetaminophen], and Atorvastatin   Review of Systems Review of Systems  Musculoskeletal:  Positive for arthralgias. Negative for joint swelling.  Neurological:  Positive for weakness and numbness.     Physical Exam Triage Vital Signs ED Triage Vitals  Enc Vitals Group     BP 01/09/22 1031 129/83     Pulse Rate 01/09/22 1031 (!) 56     Resp --      Temp 01/09/22 1031 98.9 F (37.2 C)     Temp Source 01/09/22 1031 Oral     SpO2 01/09/22 1031 100 %     Weight 01/09/22 1027 226 lb (102.5 kg)     Height 01/09/22 1027 5\' 7"  (1.702 m)     Head Circumference --      Peak Flow --      Pain Score 01/09/22 1027 7     Pain Loc --      Pain Edu? --      Excl. in [place]? --    No data found.  Updated Vital Signs BP 129/83 (BP Location: Right Arm)   Pulse (!) 56   Temp 98.9 F  (37.2 C) (Oral)   Ht 5\' 7"  (1.702 m)   Wt 226 lb (102.5 kg)   SpO2 100%   BMI 35.40 kg/m   Visual Acuity Right Eye Distance:   Left Eye Distance:   Bilateral Distance:    Right Eye Near:   Left Eye Near:    Bilateral Near:     Physical Exam  Vitals and nursing note reviewed.  Constitutional:      Appearance: Normal appearance.  Musculoskeletal:        General: Tenderness present. No swelling, deformity or signs of injury.  Skin:    General: Skin is warm and dry.     Capillary Refill: Capillary refill takes less than 2 seconds.     Findings: No erythema.  Neurological:     General: No focal deficit present.     Mental Status: She is oriented to person, place, and time.  Psychiatric:        Mood and Affect: Mood normal.        Behavior: Behavior normal.        Thought Content: Thought content normal.        Judgment: Judgment normal.      UC Treatments / Results  Labs (all labs ordered are listed, but only abnormal results are displayed) Labs Reviewed - No data to display  EKG   Radiology No results found.  Procedures Procedures (including critical care time)  Medications Ordered in UC Medications - No data to display  Initial Impression / Assessment and Plan / UC Course  I have reviewed the triage vital signs and the nursing notes.  Pertinent labs & imaging results that were available during my care of the patient were reviewed by me and considered in my medical decision making (see chart for details).   The patient is a very pleasant, nontoxic-appearing 57 year old female here for evaluation of pain in both of her hands.  She states that this has been going on for the last 4 days and is typical for her rheumatoid arthritis flare.  She does endorse some numbness to her right middle and index fingers, this is not typical of her RA flare but is typical of a carpal tunnel flare.  She does not have any gross edema of her fingers or hand.  There is no erythema or  ecchymosis noted.  She does have decreased grips in both hands and decreased range of motion of her finger secondary to pain.  Her cap refill is brisk and less than 2 seconds.  She does have positive Tinel sign on the right.  This is possibly an RA flare and it could also be a carpal tunnel flare.  It has elements of both.  I will have the patient continue her RA medications and treat her with a 6-day prednisone taper.  She is also requested a refill of her hydrocodone as she is out.  I consulted PDMP and only 1 prescription for hydrocodone in the last 90 days.  I will give her a short prescription for hydrocodone and have encouraged her to follow-up with her rheumatologist next week if her pain continues.  Work note provided.   Final Clinical Impressions(s) / UC Diagnoses   Final diagnoses:  Rheumatoid arthritis flare [facility])     Discharge Instructions      Continue your RA medication as prescribed.  Start the Prednisone today and then take it each morning at breakfast going forward. You will take it for 6 days.  Use your Norco as needed for severe pain. Do not drive or operate machinery while taking this medication. Also, do not drink alcohol.  Follow-up with your RA doctor next week if your pain persists.      ED Prescriptions     Medication Sig Dispense Auth. Provider   predniSONE (STERAPRED UNI-PAK 21 TAB) 10 MG (21) TBPK tablet Take 6 tablets on  day 1, 5 tablets day 2, 4 tablets day 3, 3 tablets day 4, 2 tablets day 5, 1 tablet day 6 21 tablet Becky Augusta, NP   HYDROcodone-acetaminophen (NORCO/VICODIN) 5-325 MG tablet Take 1-2 tablets by mouth every 6 (six) hours as needed. Not more than 6 tabs in 24 hours. 20 tablet Becky Augusta, NP      I have reviewed the PDMP during this encounter.   Becky Augusta, NP 01/09/22 1118

## 2022-01-09 NOTE — Discharge Instructions (Signed)
Continue your RA medication as prescribed.  Start the Prednisone today and then take it each morning at breakfast going forward. You will take it for 6 days.  Use your Norco as needed for severe pain. Do not drive or operate machinery while taking this medication. Also, do not drink alcohol.  Follow-up with your RA doctor next week if your pain persists.

## 2022-01-09 NOTE — ED Triage Notes (Signed)
Pt states she has had rheumatoid arthritis flare up since Thursday, pt states her doctor is out of town, pt also states RT hand pain worse than LT

## 2022-02-27 ENCOUNTER — Other Ambulatory Visit: Payer: Self-pay | Admitting: Surgery

## 2022-03-02 ENCOUNTER — Encounter
Admission: RE | Admit: 2022-03-02 | Discharge: 2022-03-02 | Disposition: A | Discharge status: Home / Self Care | Payer: 59 | Source: Ambulatory Visit | Attending: Surgery | Admitting: Surgery

## 2022-03-02 ENCOUNTER — Other Ambulatory Visit: Payer: Self-pay

## 2022-03-02 HISTORY — DX: Unspecified osteoarthritis, unspecified site: M19.90

## 2022-03-02 HISTORY — DX: Cerebral infarction, unspecified: I63.9

## 2022-03-02 HISTORY — DX: Depression, unspecified: F32.A

## 2022-03-02 HISTORY — DX: Anemia, unspecified: D64.9

## 2022-03-02 HISTORY — DX: Anxiety disorder, unspecified: F41.9

## 2022-03-02 NOTE — Patient Instructions (Addendum)
Your procedure is scheduled on: 03/08/22 - Wednesday Report to the Registration Desk on the 1st floor of the [facility]. To find out your arrival time, please call [phone] between 1PM - 3PM on: 03/07/22- Tuesday If your arrival time is 6:00 am, do not arrive before that time as the [facility] entrance doors do not open until 6:00 am.  REMEMBER: Instructions that are not followed completely may result in serious medical risk, up to and including death; or upon the discretion of your surgeon and anesthesiologist your surgery may need to be rescheduled.  Do not eat food after midnight the night before surgery.  No gum chewing or hard candies.  You may however, drink CLEAR liquids up to 2 hours before you are scheduled to arrive for your surgery. Do not drink anything within 2 hours of your scheduled arrival time.  Clear liquids include: - water  - apple juice without pulp - gatorade (not RED colors) - black coffee or tea (Do NOT add milk or creamers to the coffee or tea) Do NOT drink anything that is not on this list.   In addition, your doctor has ordered for you to drink the provided:  Ensure Pre-Surgery Clear Carbohydrate Drink  Drinking this carbohydrate drink up to two hours before surgery helps to reduce insulin resistance and improve patient outcomes. Please complete drinking 2 hours before scheduled arrival time.  One week prior to surgery: Stop Anti-inflammatories (NSAIDS) such as Advil, Aleve, Ibuprofen, Motrin, Naproxen, Naprosyn and Aspirin based products such as Excedrin, Goody's Powder, BC Powder.  Stop ANY OVER THE COUNTER supplements until after surgery.  You may take Tylenol if needed for pain up until the day of surgery.  Continue taking all prescribed medications with the exception of the following:  TAKE ONLY THESE MEDICATIONS THE MORNING OF SURGERY WITH A SIP OF WATER:   hydroxychloroquine (PLAQUENIL)  memantine (NAMENDA)  Vilazodone HCl (VIIBRYD)    No Alcohol for 24 hours before or after surgery.  No Smoking including e-cigarettes for 24 hours before surgery.  No chewable tobacco products for at least 6 hours before surgery.  No nicotine patches on the day of surgery.  Do not use any "recreational" drugs for at least a week (preferably 2 weeks) before your surgery.  Please be advised that the combination of cocaine and anesthesia may have negative outcomes, up to and including death. If you test positive for cocaine, your surgery will be cancelled.  On the morning of surgery brush your teeth with toothpaste and water, you may rinse your mouth with mouthwash if you wish. Do not swallow any toothpaste or mouthwash.  Use CHG Soap or wipes as directed on instruction sheet.  Do not wear jewelry, make-up, hairpins, clips or nail polish.  Do not wear lotions, powders, or perfumes.   Do not shave body hair from the neck down 48 hours before surgery.  Contact lenses, hearing aids and dentures may not be worn into surgery.  Do not bring valuables to the hospital. [facility] is not responsible for any missing/lost belongings or valuables.   Notify your doctor if there is any change in your medical condition (cold, fever, infection).  Wear comfortable clothing (specific to your surgery type) to the hospital.  After surgery, you can help prevent lung complications by doing breathing exercises.  Take deep breaths and cough every 1-2 hours. Your doctor may order a device called an Incentive Spirometer to help you take deep breaths. When coughing or sneezing,  hold a pillow firmly against your incision with both hands. This is called "splinting." Doing this helps protect your incision. It also decreases belly discomfort.  If you are being admitted to the hospital overnight, leave your suitcase in the car. After surgery it may be brought to your room.  In case of increased patient census, it may be necessary for you, the patient, to  continue your postoperative care in the Same Day Surgery department.  If you are being discharged the day of surgery, you will not be allowed to drive home. You will need a responsible individual to drive you home and stay with you for 24 hours after surgery.   If you are taking public transportation, you will need to have a responsible individual with you.  Please call the [facility] Dept. at [phone] if you have any questions about these instructions.  Surgery Visitation Policy:  Patients undergoing a surgery or procedure may have two family members or support persons with them as long as the person is not COVID-19 positive or experiencing its symptoms.   Inpatient Visitation:    Visiting hours are 7 a.m. to 8 p.m. Up to four visitors are allowed at one time in a patient room. The visitors may rotate out with other people during the day. One designated support person (adult) may remain overnight.  Due to an increase in RSV and influenza rates and associated hospitalizations, children ages 79 and under will not be able to visit patients in [facility].  Masks continue to be strongly recommended.     Preparing for Surgery with CHLORHEXIDINE GLUCONATE (CHG) Soap  Chlorhexidine Gluconate (CHG) Soap  o An antiseptic cleaner that kills germs and bonds with the skin to continue killing germs even after washing  o Used for showering the night before surgery and morning of surgery  Before surgery, you can play an important role by reducing the number of germs on your skin.  CHG (Chlorhexidine gluconate) soap is an antiseptic cleanser which kills germs and bonds with the skin to continue killing germs even after washing.  Please do not use if you have an allergy to CHG or antibacterial soaps. If your skin becomes reddened/irritated stop using the CHG.  1. Shower the NIGHT BEFORE SURGERY and the MORNING OF SURGERY with CHG soap.  2. If you choose to wash  your hair, wash your hair first as usual with your normal shampoo.  3. After shampooing, rinse your hair and body thoroughly to remove the shampoo.  4. Use CHG as you would any other liquid soap. You can apply CHG directly to the skin and wash gently with a scrungie or a clean washcloth.  5. Apply the CHG soap to your body only from the neck down. Do not use on open wounds or open sores. Avoid contact with your eyes, ears, mouth, and genitals (private parts). Wash face and genitals (private parts) with your normal soap.  6. Wash thoroughly, paying special attention to the area where your surgery will be performed.  7. Thoroughly rinse your body with warm water.  8. Do not shower/wash with your normal soap after using and rinsing off the CHG soap.  9. Pat yourself dry with a clean towel.  10. Wear clean pajamas to bed the night before surgery.  12. Place clean sheets on your bed the night of your first shower and do not sleep with pets.  13. Shower again with the CHG soap on the day of surgery  prior to arriving at the hospital.  14. Do not apply any deodorants/lotions/powders.  15. Please wear clean clothes to the hospital.  How to Use an Incentive Spirometer  An incentive spirometer is a tool that measures how well you are filling your lungs with each breath. Learning to take long, deep breaths using this tool can help you keep your lungs clear and active. This may help to reverse or lessen your chance of developing breathing (pulmonary) problems, especially infection. You may be asked to use a spirometer: After a surgery. If you have a lung problem or a history of smoking. After a long period of time when you have been unable to move or be active. If the spirometer includes an indicator to show the highest number that you have reached, your health care provider or respiratory therapist will help you set a goal. Keep a log of your progress as told by your health care provider. What  are the risks? Breathing too quickly may cause dizziness or cause you to pass out. Take your time so you do not get dizzy or light-headed. If you are in pain, you may need to take pain medicine before doing incentive spirometry. It is harder to take a deep breath if you are having pain. How to use your incentive spirometer  Sit up on the edge of your bed or on a chair. Hold the incentive spirometer so that it is in an upright position. Before you use the spirometer, breathe out normally. Place the mouthpiece in your mouth. Make sure your lips are closed tightly around it. Breathe in slowly and as deeply as you can through your mouth, causing the piston or the ball to rise toward the top of the chamber. Hold your breath for 3-5 seconds, or for as long as possible. If the spirometer includes a coach indicator, use this to guide you in breathing. Slow down your breathing if the indicator goes above the marked areas. Remove the mouthpiece from your mouth and breathe out normally. The piston or ball will return to the bottom of the chamber. Rest for a few seconds, then repeat the steps 10 or more times. Take your time and take a few normal breaths between deep breaths so that you do not get dizzy or light-headed. Do this every 1-2 hours when you are awake. If the spirometer includes a goal marker to show the highest number you have reached (best effort), use this as a goal to work toward during each repetition. After each set of 10 deep breaths, cough a few times. This will help to make sure that your lungs are clear. If you have an incision on your chest or abdomen from surgery, place a pillow or a rolled-up towel firmly against the incision when you cough. This can help to reduce pain while taking deep breaths and coughing. General tips When you are able to get out of bed: Walk around often. Continue to take deep breaths and cough in order to clear your lungs. Keep using the incentive spirometer  until your health care provider says it is okay to stop using it. If you have been in the hospital, you may be told to keep using the spirometer at home. Contact a health care provider if: You are having difficulty using the spirometer. You have trouble using the spirometer as often as instructed. Your pain medicine is not giving enough relief for you to use the spirometer as told. You have a fever. Get help right away  if: You develop shortness of breath. You develop a cough with bloody mucus from the lungs. You have fluid or blood coming from an incision site after you cough. Summary An incentive spirometer is a tool that can help you learn to take long, deep breaths to keep your lungs clear and active. You may be asked to use a spirometer after a surgery, if you have a lung problem or a history of smoking, or if you have been inactive for a long period of time. Use your incentive spirometer as instructed every 1-2 hours while you are awake. If you have an incision on your chest or abdomen, place a pillow or a rolled-up towel firmly against your incision when you cough. This will help to reduce pain. Get help right away if you have shortness of breath, you cough up bloody mucus, or blood comes from your incision when you cough. This information is not intended to replace advice given to you by your health care provider. Make sure you discuss any questions you have with your health care provider. Document Revised: 03/10/2019 Document Reviewed: 03/10/2019 Elsevier Patient Education  [place].

## 2022-03-08 ENCOUNTER — Ambulatory Visit: Payer: 59 | Admitting: Certified Registered"

## 2022-03-08 ENCOUNTER — Encounter
Admission: RE | Disposition: A | Discharge status: Home / Self Care | Payer: Self-pay | Source: Home / Self Care | Attending: Surgery

## 2022-03-08 ENCOUNTER — Other Ambulatory Visit: Payer: Self-pay

## 2022-03-08 ENCOUNTER — Encounter: Payer: Self-pay | Admitting: Surgery

## 2022-03-08 ENCOUNTER — Ambulatory Visit
Admission: RE | Admit: 2022-03-08 | Discharge: 2022-03-08 | Disposition: A | Discharge status: Home / Self Care | Payer: 59 | Attending: Surgery | Admitting: Surgery

## 2022-03-08 DIAGNOSIS — G5601 Carpal tunnel syndrome, right upper limb: Secondary | ICD-10-CM | POA: Insufficient documentation

## 2022-03-08 DIAGNOSIS — K219 Gastro-esophageal reflux disease without esophagitis: Secondary | ICD-10-CM | POA: Insufficient documentation

## 2022-03-08 DIAGNOSIS — F419 Anxiety disorder, unspecified: Secondary | ICD-10-CM | POA: Diagnosis not present

## 2022-03-08 DIAGNOSIS — F32A Depression, unspecified: Secondary | ICD-10-CM | POA: Insufficient documentation

## 2022-03-08 DIAGNOSIS — I10 Essential (primary) hypertension: Secondary | ICD-10-CM | POA: Diagnosis not present

## 2022-03-08 DIAGNOSIS — M069 Rheumatoid arthritis, unspecified: Secondary | ICD-10-CM | POA: Insufficient documentation

## 2022-03-08 DIAGNOSIS — Z79899 Other long term (current) drug therapy: Secondary | ICD-10-CM | POA: Insufficient documentation

## 2022-03-08 DIAGNOSIS — J45909 Unspecified asthma, uncomplicated: Secondary | ICD-10-CM | POA: Diagnosis not present

## 2022-03-08 DIAGNOSIS — E785 Hyperlipidemia, unspecified: Secondary | ICD-10-CM | POA: Diagnosis not present

## 2022-03-08 DIAGNOSIS — G2581 Restless legs syndrome: Secondary | ICD-10-CM | POA: Insufficient documentation

## 2022-03-08 HISTORY — PX: CARPAL TUNNEL RELEASE: SHX101

## 2022-03-08 SURGERY — RELEASE, CARPAL TUNNEL, ENDOSCOPIC
Anesthesia: General | Laterality: Right

## 2022-03-08 MED ORDER — PROPOFOL 10 MG/ML IV BOLUS
INTRAVENOUS | Status: DC | PRN
  Administered 2022-03-08: 200 mg via INTRAVENOUS

## 2022-03-08 MED ORDER — ACETAMINOPHEN 10 MG/ML IV SOLN: 1000.0000 mg | Freq: Once | INTRAVENOUS | Status: DC | PRN

## 2022-03-08 MED ORDER — DROPERIDOL 2.5 MG/ML IJ SOLN: 0.6250 mg | Freq: Once | INTRAMUSCULAR | Status: DC | PRN

## 2022-03-08 MED ORDER — OXYCODONE HCL 5 MG PO TABS: 5.0000 mg | ORAL_TABLET | Freq: Once | ORAL | Status: DC | PRN

## 2022-03-08 MED ORDER — MIDAZOLAM HCL 2 MG/2ML IJ SOLN
INTRAMUSCULAR | Status: AC
  Filled 2022-03-08: qty 2

## 2022-03-08 MED ORDER — OXYCODONE HCL 5 MG/5ML PO SOLN: 5.0000 mg | Freq: Once | ORAL | Status: DC | PRN

## 2022-03-08 MED ORDER — BUPIVACAINE HCL (PF) 0.5 % IJ SOLN
INTRAMUSCULAR | Status: AC
  Filled 2022-03-08: qty 30

## 2022-03-08 MED ORDER — BUPIVACAINE HCL (PF) 0.5 % IJ SOLN
INTRAMUSCULAR | Status: DC | PRN
  Administered 2022-03-08: 10 mL

## 2022-03-08 MED ORDER — CHLORHEXIDINE GLUCONATE 0.12 % MT SOLN: 15.0000 mL | Freq: Once | OROMUCOSAL | Status: AC

## 2022-03-08 MED ORDER — GLYCOPYRROLATE 0.2 MG/ML IJ SOLN
INTRAMUSCULAR | Status: DC | PRN
  Administered 2022-03-08: .2 mg via INTRAVENOUS

## 2022-03-08 MED ORDER — CHLORHEXIDINE GLUCONATE 0.12 % MT SOLN
OROMUCOSAL | Status: AC
  Administered 2022-03-08: 15 mL via OROMUCOSAL
  Filled 2022-03-08: qty 15

## 2022-03-08 MED ORDER — ONDANSETRON HCL 4 MG/2ML IJ SOLN
INTRAMUSCULAR | Status: DC | PRN
  Administered 2022-03-08: 4 mg via INTRAVENOUS

## 2022-03-08 MED ORDER — 0.9 % SODIUM CHLORIDE (POUR BTL) OPTIME
TOPICAL | Status: DC | PRN
  Administered 2022-03-08: 250 mL

## 2022-03-08 MED ORDER — DEXMEDETOMIDINE HCL IN NACL 80 MCG/20ML IV SOLN
INTRAVENOUS | Status: DC | PRN
  Administered 2022-03-08: 12 ug via BUCCAL
  Administered 2022-03-08: 8 ug via BUCCAL

## 2022-03-08 MED ORDER — FAMOTIDINE 20 MG PO TABS: 20.0000 mg | ORAL_TABLET | Freq: Once | ORAL | Status: AC

## 2022-03-08 MED ORDER — PROMETHAZINE HCL 25 MG/ML IJ SOLN: 6.2500 mg | INTRAMUSCULAR | Status: DC | PRN

## 2022-03-08 MED ORDER — MIDAZOLAM HCL 2 MG/2ML IJ SOLN
INTRAMUSCULAR | Status: DC | PRN
  Administered 2022-03-08: 2 mg via INTRAVENOUS

## 2022-03-08 MED ORDER — HYDROCODONE-ACETAMINOPHEN 5-325 MG PO TABS: 1.0000 | ORAL_TABLET | Freq: Four times a day (QID) | ORAL | 0 refills | Status: AC | PRN

## 2022-03-08 MED ORDER — CEFAZOLIN SODIUM-DEXTROSE 2-4 GM/100ML-% IV SOLN
INTRAVENOUS | Status: AC
  Filled 2022-03-08: qty 100

## 2022-03-08 MED ORDER — FENTANYL CITRATE (PF) 100 MCG/2ML IJ SOLN: 25.0000 ug | INTRAMUSCULAR | Status: DC | PRN

## 2022-03-08 MED ORDER — FENTANYL CITRATE (PF) 100 MCG/2ML IJ SOLN
INTRAMUSCULAR | Status: DC | PRN
  Administered 2022-03-08 (×2): 50 ug via INTRAVENOUS

## 2022-03-08 MED ORDER — CEFAZOLIN SODIUM-DEXTROSE 2-4 GM/100ML-% IV SOLN
2.0000 g | INTRAVENOUS | Status: AC
  Administered 2022-03-08: 2 g via INTRAVENOUS

## 2022-03-08 MED ORDER — ORAL CARE MOUTH RINSE: 15.0000 mL | Freq: Once | OROMUCOSAL | Status: AC

## 2022-03-08 MED ORDER — LACTATED RINGERS IV SOLN: INTRAVENOUS | Status: DC

## 2022-03-08 MED ORDER — FENTANYL CITRATE (PF) 100 MCG/2ML IJ SOLN
INTRAMUSCULAR | Status: AC
  Filled 2022-03-08: qty 2

## 2022-03-08 MED ORDER — DEXAMETHASONE SODIUM PHOSPHATE 10 MG/ML IJ SOLN
INTRAMUSCULAR | Status: DC | PRN
  Administered 2022-03-08: 8 mg via INTRAVENOUS

## 2022-03-08 MED ORDER — FAMOTIDINE 20 MG PO TABS
ORAL_TABLET | ORAL | Status: AC
  Administered 2022-03-08: 20 mg via ORAL
  Filled 2022-03-08: qty 1

## 2022-03-08 MED ORDER — LIDOCAINE HCL (CARDIAC) PF 100 MG/5ML IV SOSY
PREFILLED_SYRINGE | INTRAVENOUS | Status: DC | PRN
  Administered 2022-03-08: 100 mg via INTRAVENOUS

## 2022-03-08 SURGICAL SUPPLY — 37 items
APL PRP STRL LF DISP 70% ISPRP (MISCELLANEOUS) ×1
BNDG ADH 2 X3.75 FABRIC TAN LF (GAUZE/BANDAGES/DRESSINGS) IMPLANT
BNDG ADH XL 3.75X2 STRCH LF (GAUZE/BANDAGES/DRESSINGS) ×1
BNDG CMPR 5X4 CHSV STRCH STRL (GAUZE/BANDAGES/DRESSINGS)
BNDG COHESIVE 4X5 TAN STRL LF (GAUZE/BANDAGES/DRESSINGS) ×1 IMPLANT
BNDG ELASTIC 2X5.8 VLCR STR LF (GAUZE/BANDAGES/DRESSINGS) ×1 IMPLANT
BNDG ESMARCH 4 X 12 STRL LF (GAUZE/BANDAGES/DRESSINGS) ×1
BNDG ESMARCH 4X12 STRL LF (GAUZE/BANDAGES/DRESSINGS) ×1 IMPLANT
CHLORAPREP W/TINT 26 (MISCELLANEOUS) ×1 IMPLANT
CORD BIP STRL DISP 12FT (MISCELLANEOUS) ×1 IMPLANT
CUFF TOURN SGL QUICK 18X4 (TOURNIQUET CUFF) ×1 IMPLANT
DRAPE SURG 17X11 SM STRL (DRAPES) ×1 IMPLANT
FORCEPS JEWEL BIP 4-3/4 STR (INSTRUMENTS) ×1 IMPLANT
GAUZE SPONGE 4X4 12PLY STRL (GAUZE/BANDAGES/DRESSINGS) ×1 IMPLANT
GAUZE XEROFORM 1X8 LF (GAUZE/BANDAGES/DRESSINGS) ×1 IMPLANT
GLOVE BIO SURGEON STRL SZ8 (GLOVE) ×1 IMPLANT
GLOVE SURG UNDER LTX SZ8 (GLOVE) ×1 IMPLANT
GOWN STRL REUS W/ TWL LRG LVL3 (GOWN DISPOSABLE) ×1 IMPLANT
GOWN STRL REUS W/ TWL XL LVL3 (GOWN DISPOSABLE) ×1 IMPLANT
GOWN STRL REUS W/TWL LRG LVL3 (GOWN DISPOSABLE) ×1
GOWN STRL REUS W/TWL XL LVL3 (GOWN DISPOSABLE) ×1
KIT CARPAL TUNNEL (MISCELLANEOUS) ×1
KIT ESCP INSRT D SLOT CANN KN (MISCELLANEOUS) ×1 IMPLANT
KIT TURNOVER KIT A (KITS) ×1 IMPLANT
MANIFOLD NEPTUNE II (INSTRUMENTS) ×1 IMPLANT
NS IRRIG 500ML POUR BTL (IV SOLUTION) ×1 IMPLANT
PACK EXTREMITY ARMC (MISCELLANEOUS) ×1 IMPLANT
SPLINT WRIST LG LT TX990309 (SOFTGOODS) IMPLANT
SPLINT WRIST LG RT TX900304 (SOFTGOODS) IMPLANT
SPLINT WRIST M LT TX990308 (SOFTGOODS) IMPLANT
SPLINT WRIST M RT TX990303 (SOFTGOODS) IMPLANT
SPLINT WRIST XL LT TX990310 (SOFTGOODS) IMPLANT
SPLINT WRIST XL RT TX990305 (SOFTGOODS) IMPLANT
STOCKINETTE IMPERVIOUS 9X36 MD (GAUZE/BANDAGES/DRESSINGS) ×1 IMPLANT
SUT PROLENE 4 0 PS 2 18 (SUTURE) ×1 IMPLANT
TRAP FLUID SMOKE EVACUATOR (MISCELLANEOUS) ×1 IMPLANT
WATER STERILE IRR 500ML POUR (IV SOLUTION) ×1 IMPLANT

## 2022-03-08 NOTE — Discharge Instructions (Addendum)
Orthopedic discharge instructions: Keep dressing dry and intact. Keep hand elevated above heart level. May shower after dressing removed on postop day 4 (Sunday). Cover sutures with Band-Aids after drying off, then reapply Velcro splint. Apply ice to affected area frequently. Take ibuprofen 600-800 mg TID with meals for 3-5 days, then as necessary. Take ES Tylenol or pain medication as prescribed when needed.  Return for follow-up in 10-14 days or as scheduled.   AMBULATORY SURGERY  DISCHARGE INSTRUCTIONS   The drugs that you were given will stay in your system until tomorrow so for the next 24 hours you should not:  Drive an automobile Make any legal decisions Drink any alcoholic beverage   You may resume regular meals tomorrow.  Today it is better to start with liquids and gradually work up to solid foods.  You may eat anything you prefer, but it is better to start with liquids, then soup and crackers, and gradually work up to solid foods.   Please notify your doctor immediately if you have any unusual bleeding, trouble breathing, redness and pain at the surgery site, drainage, fever, or pain not relieved by medication.    Additional Instructions:        Please contact your physician with any problems or Same Day Surgery at [phone], Monday through Friday 6 am to 4 pm, or [place] [facility] number at [phone].

## 2022-03-08 NOTE — Transfer of Care (Signed)
Immediate Anesthesia Transfer of Care Note  Patient: Brandi Rose  Procedure(s) Performed: Procedure(s): CARPAL TUNNEL RELEASE ENDOSCOPIC (Right)  Patient Location: PACU  Anesthesia Type:General  Level of Consciousness: sedated  Airway & Oxygen Therapy: Patient Spontanous Breathing and Patient connected to face mask oxygen  Post-op Assessment: Report given to RN and Post -op Vital signs reviewed and stable  Post vital signs: Reviewed and stable  Last Vitals:  Vitals:   03/08/22 0951 03/08/22 1145  BP: 120/88 132/88  Pulse: 65 74  Resp: 16 17  Temp: (!) 36.3 C (!) 36.3 C  SpO2: [id number]     Complications: No apparent anesthesia complications

## 2022-03-08 NOTE — H&P (Signed)
History of Present Illness: Brandi Rose is a 57 y.o. female who presents today for repeat evaluation of the right hand numbness and tingling and burning. The patient has been diagnosed with bilateral carpal tunnel syndrome in the past, the patient did initially undergo a right carpal tunnel steroid injection in 2022 which did provide significant relief of right hand numbness and tingling, the patient was diagnosed with rheumatoid arthritis recently. In the last several weeks she began to experience a rheumatoid flare and presented to the urgent care clinic where she did receive a prednisone taper which did relieve her right wrist pain however the patient continued to experience a burning and tingling felt primarily in the right middle finger in addition to the radial aspect of the ring finger, index finger and thumb. The patient was evaluated on 02/16/2022 where she underwent a repeat right carpal tunnel steroid injection which did not provide a significant amount of relief as it had in the past. The patient continues report the burning and tingling right hand. She is right-hand dominant. She denies any surgical history to the right hand but she does have a history of left wrist surgery. The patient denies any personal history of heart attack. She does have a history of TIA, she is not on a blood thinning medication. She denies any history of asthma or COPD. No personal history of DVT. She is not diabetic.  Past Medical History: Anxiety 1985  Arthritis 2000  Asthma, unspecified asthma severity, unspecified whether complicated, unspecified whether persistent 1990  Allergy related  Atherosclerosis of both carotid arteries 11/21/2019  Depression 2004  GERD (gastroesophageal reflux disease) 04/13/2016  History of stroke Jan 2020  MRI showed that I had 2 "mini" strokes in the past.  Hyperlipidemia [id number]  Hypertension  Insomnia  Migraine  RLS (restless legs syndrome)  Seizures (CMS-HCC) 35  yrs  Passed out , body became rigid. It lasted less than a minute  Syncope and collapse 1980  Several episodes as an adolescent.   Past Surgical History:  OOPHORECTOMY 1998 (Removed right ovary)  EGD 04/13/2016 (GERD/Gastritis/No Repeat/MUS)  APPENDECTOMY  COLONOSCOPY  HYSTERECTOMY (has only one ovary)  KNEE ARTHROSCOPY (Right)  TONSILLECTOMY & ADENOIDECTOMY  UPPER GASTROINTESTINAL ENDOSCOPY   Past Family History: High blood pressure (Hypertension) Mother  Thyroid disease Mother  Atrial fibrillation (Abnormal heart rhythm sometimes requiring treatment with blood thinners) Mother  Hyperlipidemia (Elevated cholesterol) Mother  Arthritis Mother  High blood pressure (Hypertension) Father  Coronary Artery Disease (Blocked arteries around heart) Father  Myocardial Infarction (Heart attack) Father  Heart disease Father  Suicidality Brother  suicide 2016  Migraines Daughter  Started 2007 ish  Migraines Daughter  Started 2011  Fainting (Syncope-sudden loss of consciousness) Maternal Grandmother  Intolerance to statin medications Maternal Grandmother  CoQ 10 has helped that issue  Dementia Maternal Grandmother  Last 2 years of her life  Migraines Maternal Grandmother  Arthritis Maternal Grandmother  Coronary Artery Disease (Blocked arteries around heart) Maternal Grandfather  Myocardial Infarction (Heart attack) Maternal Grandfather  Heart disease Maternal Grandfather  High blood pressure (Hypertension) Maternal Grandfather  Arthritis Maternal Grandfather  Coronary Artery Disease (Blocked arteries around heart) Paternal Grandfather  Myocardial Infarction (Heart attack) Paternal Grandfather  High blood pressure (Hypertension) Paternal Grandfather  Arthritis Paternal Grandfather  Arthritis Paternal Grandmother  Colon cancer Neg Hx  Colon polyps Neg Hx  Liver disease Neg Hx  Rectal cancer Neg Hx  Ulcers Neg Hx  Glaucoma Neg Hx  Macular degeneration Neg Hx  Medications: albuterol 90 mcg/actuation inhaler Inhale 2 inhalations into the lungs every 6 (six) hours as needed for Wheezing 18 g 12  ARIPiprazole (ABILIFY) 5 MG tablet Take 1 tablet (5 mg total) by mouth once daily 90 tablet 3  aspirin 81 MG EC tablet Take 81 mg by mouth once daily  atorvastatin (LIPITOR) 40 MG tablet Take 1 tablet (40 mg total) by mouth once daily 30 tablet 11  butalbital-acetaminophen-caffeine (FIORICET) 50-325-40 mg tablet Take 1 tablet by mouth every 6 (six) hours as needed for Headache 20 tablet 3  clonazePAM (KLONOPIN) 0.5 MG tablet Take by mouth  folic acid (FOLVITE) 1 MG tablet Take 1 tablet (1 mg total) by mouth once daily 90 tablet 3  HYDROcodone-acetaminophen (NORCO) 5-325 mg tablet Take by mouth  hydroxychloroquine (PLAQUENIL) 200 mg tablet Take 1 tablet (200 mg total) by mouth 2 (two) times daily 60 tablet 5  lisinopriL-hydrochlorothiazide (ZESTORETIC) 20-12.5 mg tablet Take 1 tablet by mouth once daily 90 tablet 3  memantine (NAMENDA) 5 MG tablet Take 1 tablet (5 mg total) by mouth 2 (two) times daily 60 tablet 11  methotrexate (RHEUMATREX) 2.5 MG tablet Take 6 tablets (15 mg total) by mouth every 7 (seven) days All on the same day 72 tablet 1  nortriptyline (PAMELOR) 50 MG capsule Take 1 capsule (50 mg total) by mouth at bedtime 90 capsule 3  pantoprazole (PROTONIX) 40 MG DR tablet Take 1 tablet (40 mg total) by mouth once daily for 90 days 90 tablet 0  predniSONE (DELTASONE) 10 mg tablet pack Take 6 tablets on day 1, 5 tablets day 2, 4 tablets day 3, 3 tablets day 4, 2 tablets day 5, 1 tablet day 6  ubrogepant 100 mg Tab Take 100 mg by mouth as directed (Migraines) May repeat 2nd dose in 2 hours. NO MORE THAN 200 MG/DAY 10 tablet 6  valACYclovir (VALTREX) 1000 MG tablet Take 1,000 mg by mouth as needed  vilazodone (VIIBRYD) 10 mg tablet Take 2 tablets (20 mg total) by mouth once daily TAKE 1 TABLET BY MOUTH DAILY FOR 1 WEEK THEN TAKE 2 TABLETS BY MOUTH DAILY  60 tablet 5   Allergies: Bupropion Anaphylaxis (Concern contributed to pancreatitis)  Atorvastatin (Muscle Pain, Pt had extreme fatigue)  Donepezil Other (Concern contributed to pancreatitis)  Latex Rash  Pravastatin Other (Concern contributed to pancreatitis)  Wellbutrin [Bupropion Hcl] Other (Concern contributed to pancreatitis)  Sulfa (Sulfonamide Antibiotics) - (Itching, Other Reaction)   Review of Systems:  A comprehensive 14 point ROS was performed, reviewed by me today, and the pertinent orthopaedic findings are documented in the HPI.  Physical Exam: BP 126/86  Ht 170.2 cm ('5\' 7"'$ )  Wt (!) 108.9 kg (240 lb)  BMI 37.59 kg/m  General/Constitutional: The patient appears to be well-nourished, well-developed, and in no acute distress. Neuro/Psych: Normal mood and affect, oriented to person, place and time. Eyes: Non-icteric. Pupils are equal, round, and reactive to light, and exhibit synchronous movement. ENT: Unremarkable. Lymphatic: No palpable adenopathy. Respiratory: Lungs clear to auscultation, Normal chest excursion, No wheezes, and Non-labored breathing Cardiovascular: Regular rate and rhythm. No murmurs. and No edema, swelling or tenderness, except as noted in detailed exam. Integumentary: No impressive skin lesions present, except as noted in detailed exam. Musculoskeletal: Unremarkable, except as noted in detailed exam.  Neck: Neck has full range of motion. There is no tenderness to palpation. Spurling's test is negative.   Right Upper Extremity: Normal shoulder contour. Good active and passive range of motion and  stability of the shoulder, elbow, and wrist. Normal motion of the hand and digits. Swelling has improved to the right hand at today's visit. There is no triggering or locking of the digits noted. There appears to be some mild thenar atrophy noted to the right hand. No intrinsic wasting. The patient has a positive right Phalen's test. The patient has a positive  Tinel's test. The patient does report numbness in the right middle finger and slight loss of sensation along the index and ring fingers. There is normal grip strength and pincer strength. The patient has less than 2 second capillary refill with good skin warmth. Normal radial and ulnar pulse is palpated.   Neurologic: Sensory function is intact, except as noted above. Motor strength is 5/5, except as noted above. No tremor or clonus is present. Good motor coordination is noted.   Imaging: AP, lateral and oblique images of the right hand were obtained previously in the office and reviewed by me today. These x-rays demonstrate no evidence of significant osteoarthritic changes, there is no erosion to the bone. The patient does have soft tissue swelling noted along the dorsal aspect of the right wrist. No evidence of acute fracture or dislocation at today's visit.  Impression: Right carpal tunnel syndrome.  Plan:  1. Treatment options were discussed today with the patient. 2. Patient continues to experience carpal tunnel syndrome to the right upper extremity at today's visit without relief with her previous injection. 3. After discussion of the pros and cons of conservative versus aggressive treatment options, the patient would like to proceed with a right endoscopic carpal tunnel release to be performed by Dr. Roland Rack. 4. This document will serve as a surgical history and physical for the patient. 5. She will follow-up per standard postop protocol at this time. 6. They can call the clinic they have any questions, new symptoms develop or symptoms worsen.  The procedure was discussed with the patient, as were the potential risks (including bleeding, infection, nerve and/or blood vessel injury, persistent or recurrent pain, failure of the release, continued numbness, progression of arthritis, need for further surgery, blood clots, strokes, heart attacks and/or arhythmias, pneumonia, etc.) and benefits.  The patient states her understanding and wishes to proceed.    H&P reviewed and patient re-examined. No changes.

## 2022-03-08 NOTE — Anesthesia Procedure Notes (Signed)
Procedure Name: LMA Insertion Date/Time: 03/08/2022 11:07 AM  Performed by: Lerry Liner, CRNAPre-anesthesia Checklist: Patient identified, Emergency Drugs available, Suction available and Patient being monitored Patient Re-evaluated:Patient Re-evaluated prior to induction Oxygen Delivery Method: Circle system utilized Preoxygenation: Pre-oxygenation with 100% oxygen Induction Type: IV induction Ventilation: Mask ventilation without difficulty LMA: LMA inserted LMA Size: 4.0 Number of attempts: 1 Placement Confirmation: positive ETCO2 and breath sounds checked- equal and bilateral Tube secured with: Tape Dental Injury: Teeth and Oropharynx as per pre-operative assessment

## 2022-03-08 NOTE — Op Note (Signed)
03/08/2022  11:47 AM  Patient:   Brandi Rose  Pre-Op Diagnosis:   Right carpal tunnel syndrome.  Post-Op Diagnosis:   Same.  Procedure:   Endoscopic right carpal tunnel release.  Surgeon:   Pascal Lux, MD  Anesthesia:   General LMA  Findings:   As above.  Complications:   None  EBL:   0 cc  Fluids:   300 cc crystalloid  TT:   9 minutes at 250 mmHg  Drains:   None  Closure:   4-0 Prolene interrupted sutures  Brief Clinical Note:   The patient is a 57 year old female with a long history of gradually worsening pain and paresthesias to her right hand. Her symptoms have progressed despite medications, activity modification, etc. Her history and examination are consistent with carpal tunnel syndrome. The patient presents at this time for an endoscopic right carpal tunnel release.   Procedure:   The patient was brought into the operating room and lain in the supine position. After adequate general laryngeal mask anesthesia was obtained, the right hand and upper extremity were prepped with ChloraPrep solution before being draped sterilely. Preoperative antibiotics were administered. A timeout was performed to verify the appropriate surgical site before the limb was exsanguinated with an Esmarch and the tourniquet inflated to 250 mmHg.   An approximately 1.5-2 cm incision was made over the volar wrist flexion crease, centered over the palmaris longus tendon. The incision was carried down through the subcutaneous tissues with care taken to identify and protect any neurovascular structures. The distal forearm fascia was penetrated just proximal to the transverse carpal ligament. The soft tissues were released off the superficial and deep surfaces of the distal forearm fascia and this was released proximally for 3-4 cm under direct visualization.  Attention was directed distally. The Soil scientist was passed beneath the transverse carpal ligament along the ulnar aspect of the  carpal tunnel and used to release any adhesions as well as to remove any adherent synovial tissue before first the smaller then the larger of the two dilators were passed beneath the transverse carpal ligament along the ulnar margin of the carpal tunnel. The slotted cannula was introduced and the endoscope was placed into the slotted cannula and the undersurface of the transverse carpal ligament visualized. The distal margin of the transverse carpal ligament was marked by placing a 25-gauge needle percutaneously at Steger cardinal point so that it entered the distal portion of the slotted cannula. Under endoscopic visualization, the transverse carpal ligament was released from proximal to distal using the end-cutting blade. A second pass was performed to ensure complete release of the ligament. The adequacy of release was verified both endoscopically and by palpation using the freer elevator.  The wound was irrigated thoroughly with sterile saline solution before being closed using 4-0 Prolene interrupted sutures. A total of 10 cc of 0.5% plain Sensorcaine was injected in and around the incision before a sterile bulky dressing was applied to the wound. The patient was placed into a volar wrist splint before being awakened, extubated, and returned to the recovery room in satisfactory condition after tolerating the procedure well.

## 2022-03-08 NOTE — Anesthesia Preprocedure Evaluation (Signed)
Anesthesia Evaluation  Patient identified by MRN, date of birth, ID band Patient awake    Reviewed: Allergy & Precautions, H&P , NPO status , Patient's Chart, lab work & pertinent test results, reviewed documented beta blocker date and time   Airway Mallampati: III  TM Distance: >3 FB Neck ROM: full    Dental  (+) Teeth Intact   Pulmonary asthma    Pulmonary exam normal        Cardiovascular Exercise Tolerance: Good hypertension, On Medications negative cardio ROS Normal cardiovascular exam Rate:Normal     Neuro/Psych  Headaches PSYCHIATRIC DISORDERS Anxiety Depression    CVA    GI/Hepatic Neg liver ROS,GERD  Medicated,,  Endo/Other  negative endocrine ROS    Renal/GU negative Renal ROS  negative genitourinary   Musculoskeletal   Abdominal   Peds  Hematology  (+) Blood dyscrasia, anemia   Anesthesia Other Findings   Reproductive/Obstetrics negative OB ROS                             Anesthesia Physical Anesthesia Plan  ASA: 3  Anesthesia Plan: General LMA   Post-op Pain Management:    Induction:   PONV Risk Score and Plan:   Airway Management Planned:   Additional Equipment:   Intra-op Plan:   Post-operative Plan:   Informed Consent: I have reviewed the patients History and Physical, chart, labs and discussed the procedure including the risks, benefits and alternatives for the proposed anesthesia with the patient or authorized representative who has indicated his/her understanding and acceptance.       Plan Discussed with: CRNA  Anesthesia Plan Comments:        Anesthesia Quick Evaluation

## 2022-03-09 ENCOUNTER — Encounter: Payer: Self-pay | Admitting: Surgery

## 2022-03-09 NOTE — Anesthesia Postprocedure Evaluation (Signed)
Anesthesia Post Note  Patient: Brandi Rose  Procedure(s) Performed: CARPAL TUNNEL RELEASE ENDOSCOPIC (Right)  Patient location during evaluation: PACU Anesthesia Type: General Level of consciousness: awake and alert Pain management: pain level controlled Vital Signs Assessment: post-procedure vital signs reviewed and stable Respiratory status: spontaneous breathing, nonlabored ventilation, respiratory function stable and patient connected to nasal cannula oxygen Cardiovascular status: blood pressure returned to baseline and stable Postop Assessment: no apparent nausea or vomiting Anesthetic complications: no   No notable events documented.   Last Vitals:  Vitals:   03/08/22 1213 03/08/22 1217  BP: 136/77 124/74  Pulse: 76 74  Resp: 11 14  Temp: 36.6 C (!) 36.2 C  SpO2: 99% 96%    Last Pain:  Vitals:   03/08/22 1217  TempSrc: Temporal  PainSc: 0-No pain                 Molli Barrows

## 2022-07-20 ENCOUNTER — Encounter: Payer: Self-pay | Admitting: *Deleted

## 2022-07-21 ENCOUNTER — Ambulatory Visit: Admission: RE | Admit: 2022-07-21 | Payer: BC Managed Care – PPO | Source: Home / Self Care

## 2022-07-21 ENCOUNTER — Encounter: Admission: RE | Payer: Self-pay | Source: Home / Self Care

## 2022-07-21 SURGERY — COLONOSCOPY WITH PROPOFOL
Anesthesia: General

## 2022-09-25 ENCOUNTER — Other Ambulatory Visit: Payer: Self-pay

## 2022-10-02 ENCOUNTER — Encounter: Payer: Self-pay | Admitting: *Deleted

## 2022-10-02 ENCOUNTER — Ambulatory Visit: Payer: BC Managed Care – PPO | Admitting: Anesthesiology

## 2022-10-02 ENCOUNTER — Ambulatory Visit
Admission: RE | Admit: 2022-10-02 | Discharge: 2022-10-02 | Disposition: A | Discharge status: Home / Self Care | Payer: BC Managed Care – PPO | Attending: Gastroenterology | Admitting: Gastroenterology

## 2022-10-02 ENCOUNTER — Encounter
Admission: RE | Disposition: A | Discharge status: Home / Self Care | Payer: Self-pay | Source: Home / Self Care | Attending: Gastroenterology

## 2022-10-02 DIAGNOSIS — K640 First degree hemorrhoids: Secondary | ICD-10-CM | POA: Insufficient documentation

## 2022-10-02 DIAGNOSIS — J45909 Unspecified asthma, uncomplicated: Secondary | ICD-10-CM | POA: Diagnosis not present

## 2022-10-02 DIAGNOSIS — K219 Gastro-esophageal reflux disease without esophagitis: Secondary | ICD-10-CM | POA: Diagnosis not present

## 2022-10-02 DIAGNOSIS — D123 Benign neoplasm of transverse colon: Secondary | ICD-10-CM | POA: Diagnosis not present

## 2022-10-02 DIAGNOSIS — K589 Irritable bowel syndrome without diarrhea: Secondary | ICD-10-CM | POA: Insufficient documentation

## 2022-10-02 DIAGNOSIS — Z1211 Encounter for screening for malignant neoplasm of colon: Secondary | ICD-10-CM | POA: Diagnosis present

## 2022-10-02 DIAGNOSIS — E785 Hyperlipidemia, unspecified: Secondary | ICD-10-CM | POA: Diagnosis not present

## 2022-10-02 DIAGNOSIS — I10 Essential (primary) hypertension: Secondary | ICD-10-CM | POA: Diagnosis not present

## 2022-10-02 HISTORY — PX: COLONOSCOPY WITH PROPOFOL: SHX5780

## 2022-10-02 HISTORY — PX: POLYPECTOMY: SHX5525

## 2022-10-02 SURGERY — COLONOSCOPY WITH PROPOFOL
Anesthesia: General

## 2022-10-02 MED ORDER — PROPOFOL 10 MG/ML IV BOLUS
INTRAVENOUS | Status: AC
  Filled 2022-10-02: qty 40

## 2022-10-02 MED ORDER — DEXMEDETOMIDINE HCL IN NACL 80 MCG/20ML IV SOLN
INTRAVENOUS | Status: DC | PRN
Start: 2022-10-02 — End: 2022-10-02
  Administered 2022-10-02: 8 ug via INTRAVENOUS
  Administered 2022-10-02: 4 ug via INTRAVENOUS

## 2022-10-02 MED ORDER — PROPOFOL 500 MG/50ML IV EMUL
INTRAVENOUS | Status: DC | PRN
Start: 2022-10-02 — End: 2022-10-02
  Administered 2022-10-02: 100 ug/kg/min via INTRAVENOUS
  Administered 2022-10-02: 20 mg via INTRAVENOUS
  Administered 2022-10-02: 80 mg via INTRAVENOUS

## 2022-10-02 MED ORDER — LIDOCAINE HCL (CARDIAC) PF 100 MG/5ML IV SOSY
PREFILLED_SYRINGE | INTRAVENOUS | Status: DC | PRN
  Administered 2022-10-02: 50 mg via INTRAVENOUS

## 2022-10-02 MED ORDER — LIDOCAINE HCL (PF) 2 % IJ SOLN
INTRAMUSCULAR | Status: AC
  Filled 2022-10-02: qty 5

## 2022-10-02 MED ORDER — PHENYLEPHRINE 80 MCG/ML (10ML) SYRINGE FOR IV PUSH (FOR BLOOD PRESSURE SUPPORT)
PREFILLED_SYRINGE | INTRAVENOUS | Status: DC | PRN
  Administered 2022-10-02: 80 ug via INTRAVENOUS

## 2022-10-02 MED ORDER — SODIUM CHLORIDE 0.9 % IV SOLN: INTRAVENOUS | Status: DC

## 2022-10-02 NOTE — Anesthesia Postprocedure Evaluation (Signed)
Anesthesia Post Note  Patient: Brandi Rose  Procedure(s) Performed: COLONOSCOPY WITH PROPOFOL POLYPECTOMY  Patient location during evaluation: PACU Anesthesia Type: General Level of consciousness: awake and alert Pain management: pain level controlled Vital Signs Assessment: post-procedure vital signs reviewed and stable Respiratory status: spontaneous breathing, nonlabored ventilation and respiratory function stable Cardiovascular status: blood pressure returned to baseline and stable Postop Assessment: no apparent nausea or vomiting Anesthetic complications: no   No notable events documented.   Last Vitals:  Vitals:   10/02/22 1353 10/02/22 1403  BP: 123/73 131/84  Pulse: 74 (!) 50  Resp: 20 19  Temp:    SpO2: 100% 92%    Last Pain:  Vitals:   10/02/22 1403  TempSrc:   PainSc: 0-No pain                 Foye Deer

## 2022-10-02 NOTE — Anesthesia Preprocedure Evaluation (Signed)
Anesthesia Evaluation  Patient identified by MRN, date of birth, ID band Patient awake    Reviewed: Allergy & Precautions, H&P , NPO status , Patient's Chart, lab work & pertinent test results, reviewed documented beta blocker date and time   Airway Mallampati: III  TM Distance: >3 FB Neck ROM: full    Dental  (+) Teeth Intact, Implants   Pulmonary asthma , sleep apnea    Pulmonary exam normal        Cardiovascular Exercise Tolerance: Good hypertension, On Medications Normal cardiovascular exam Rate:Normal     Neuro/Psych  Headaches PSYCHIATRIC DISORDERS Anxiety Depression    TIA   GI/Hepatic Neg liver ROS,GERD  Medicated,,  Endo/Other  negative endocrine ROS    Renal/GU negative Renal ROS  negative genitourinary   Musculoskeletal   Abdominal  (+) + obese  Peds  Hematology  (+) Blood dyscrasia, anemia   Anesthesia Other Findings   Reproductive/Obstetrics negative OB ROS                             Anesthesia Physical Anesthesia Plan  ASA: 3  Anesthesia Plan: General LMA   Post-op Pain Management:    Induction: Intravenous  PONV Risk Score and Plan:   Airway Management Planned: Natural Airway  Additional Equipment:   Intra-op Plan:   Post-operative Plan:   Informed Consent: I have reviewed the patients History and Physical, chart, labs and discussed the procedure including the risks, benefits and alternatives for the proposed anesthesia with the patient or authorized representative who has indicated his/her understanding and acceptance.       Plan Discussed with: CRNA  Anesthesia Plan Comments:        Anesthesia Quick Evaluation

## 2022-10-02 NOTE — Op Note (Signed)
[facility] Gastroenterology Patient Name: Brandi Rose Procedure Date: 10/02/2022 1:18 PM MRN: [id number] Account #: [id number] Date of Birth: 12-30-1965 Admit Type: Outpatient Age: 57 Room: [facility] ENDO ROOM 3 Gender: Female Note Status: Finalized Instrument Name: Prentice Docker [id number] Procedure:             Colonoscopy Indications:           Screening for colorectal malignant neoplasm Providers:             Eather Colas MD, MD Referring MD:          [facility] Medicines:             Monitored Anesthesia Care Complications:         No immediate complications. Estimated blood loss:                         Minimal. Procedure:             Pre-Anesthesia Assessment:                        - Prior to the procedure, a History and Physical was                         performed, and patient medications and allergies were                         reviewed. The patient is competent. The risks and                         benefits of the procedure and the sedation options and                         risks were discussed with the patient. All questions                         were answered and informed consent was obtained.                         Patient identification and proposed procedure were                         verified by the physician, the nurse, the                         anesthesiologist, the anesthetist and the technician                         in the endoscopy suite. Mental Status Examination:                         alert and oriented. Airway Examination: normal                         oropharyngeal airway and neck mobility. Respiratory                         Examination: clear to auscultation. CV Examination:  normal. Prophylactic Antibiotics: The patient does not                         require prophylactic antibiotics. Prior                         Anticoagulants: The patient has taken no anticoagulant                          or antiplatelet agents. ASA Grade Assessment: III - A                         patient with severe systemic disease. After reviewing                         the risks and benefits, the patient was deemed in                         satisfactory condition to undergo the procedure. The                         anesthesia plan was to use monitored anesthesia care                         (MAC). Immediately prior to administration of                         medications, the patient was re-assessed for adequacy                         to receive sedatives. The heart rate, respiratory                         rate, oxygen saturations, blood pressure, adequacy of                         pulmonary ventilation, and response to care were                         monitored throughout the procedure. The physical                         status of the patient was re-assessed after the                         procedure.                        After obtaining informed consent, the colonoscope was                         passed under direct vision. Throughout the procedure,                         the patient's blood pressure, pulse, and oxygen                         saturations were monitored continuously. The  Colonoscope was introduced through the anus and                         advanced to the the cecum, identified by appendiceal                         orifice and ileocecal valve. The colonoscopy was                         somewhat difficult due to poor bowel prep. The patient                         tolerated the procedure well. The quality of the bowel                         preparation was inadequate. The ileocecal valve,                         appendiceal orifice, and rectum were photographed. Findings:      The perianal and digital rectal examinations were normal.      A 10 mm polyp was found in the hepatic flexure. The polyp was sessile.       The polyp was removed  with a cold snare. Resection and retrieval were       complete. Estimated blood loss was minimal.      Internal hemorrhoids were found during retroflexion. The hemorrhoids       were Grade I (internal hemorrhoids that do not prolapse).      The exam was otherwise without abnormality on direct and retroflexion       views. Impression:            - Preparation of the colon was inadequate.                        - One 10 mm polyp at the hepatic flexure, removed with                         a cold snare. Resected and retrieved.                        - Internal hemorrhoids.                        - The examination was otherwise normal on direct and                         retroflexion views. Recommendation:        - Discharge patient to home.                        - Resume previous diet.                        - Continue present medications.                        - Await pathology results.                        - Repeat colonoscopy in 6 months  because the bowel                         preparation was suboptimal.                        - Return to referring physician as previously                         scheduled. Procedure Code(s):     --- Professional ---                        [phone], Colonoscopy, flexible; with removal of                         tumor(s), polyp(s), or other lesion(s) by snare                         technique Diagnosis Code(s):     --- Professional ---                        Z12.11, Encounter for screening for malignant neoplasm                         of colon                        K64.0, First degree hemorrhoids                        D12.3, Benign neoplasm of transverse colon (hepatic                         flexure or splenic flexure) CPT copyright 2022 American [employer]. All rights reserved. The codes documented in this report are preliminary and upon coder review may  be revised to meet current compliance requirements. Eather Colas MD,  MD 10/02/2022 1:44:15 PM Number of Addenda: 0 Note Initiated On: 10/02/2022 1:18 PM Scope Withdrawal Time: 0 hours 7 minutes 38 seconds  Total Procedure Duration: 0 hours 16 minutes 21 seconds  Estimated Blood Loss:  Estimated blood loss was minimal.      [facility]

## 2022-10-02 NOTE — H&P (Signed)
Outpatient short stay form Pre-procedure 10/02/2022  Regis Bill, MD  Primary Physician: Rolm Gala, MD  Reason for visit:  Screening  History of present illness:    57 y/o lady with history of RA, HLD, and hypertension here for screening colonoscopy. No blood thinners. History of appendectomy and hysterectomy. No family history of GI malignancies.    Current Facility-Administered Medications:    0.9 %  sodium chloride infusion, , Intravenous, Continuous, Glorian Mcdonell, Rossie Muskrat, MD  Medications Prior to Admission  Medication Sig Dispense Refill Last Dose   ARIPiprazole (ABILIFY) 5 MG tablet Take 5 mg by mouth daily.   10/01/2022   atorvastatin (LIPITOR) 40 MG tablet Take 40 mg by mouth daily.   10/01/2022   hydroxychloroquine (PLAQUENIL) 200 MG tablet Take 200 mg by mouth 2 (two) times daily.   10/01/2022   memantine (NAMENDA) 5 MG tablet Take 5 mg by mouth 2 (two) times daily.   10/01/2022   methotrexate (RHEUMATREX) 2.5 MG tablet Take by mouth. 8 pills Q friday   Past Week   nortriptyline (PAMELOR) 50 MG capsule Take 50 mg by mouth at bedtime.   10/01/2022   Vilazodone HCl (VIIBRYD) 10 MG TABS Take by mouth daily.   10/01/2022   clonazePAM (KLONOPIN) 0.5 MG tablet Take 0.5 mg by mouth as needed.    at prn   HYDROcodone-acetaminophen (NORCO/VICODIN) 5-325 MG tablet Take 1-2 tablets by mouth every 6 (six) hours as needed for moderate pain or severe pain. Not more than 6 tabs in 24 hours. 20 tablet 0  at prn   levocetirizine (XYZAL) 2.5 MG/5ML solution Take 2.5 mg by mouth as needed for allergies.    at prn   lisinopril-hydrochlorothiazide (PRINZIDE,ZESTORETIC) 20-12.5 MG tablet Take 1 tablet by mouth daily. (Patient not taking: Reported on 10/02/2022)   Not Taking   UBRELVY 100 MG TABS TAKE 1 TABLET BY MOUTH AS DIRECTED. MAY REPEAT 2ND DOSE IN 2 HOURS. NO MORE THAN 2 DOSES PER DAY    at prn   valACYclovir (VALTREX) 1000 MG tablet Take 1,000 mg by mouth as needed.    at prn      Allergies  Allergen Reactions   Bupropion Anaphylaxis    Concern contributed to pancreatitis   Donepezil Other (See Comments)    Concern contributed to pancreatitis   Latex    Pravastatin    Sulfa Antibiotics Other (See Comments)    Bad headach   Vicodin [Hydrocodone-Acetaminophen]     Can not take the brand name vicodine     Past Medical History:  Diagnosis Date   Anemia    Anxiety    Arthritis    RA   Asthma    Depression    GERD (gastroesophageal reflux disease)    Headache    History of IBS    Hypertension    Insomnia    RLS (restless legs syndrome)    Stroke (HCC)    tia    Review of systems:  Otherwise negative.    Physical Exam  Gen: Alert, oriented. Appears stated age.  HEENT: PERRLA. Lungs: No respiratory distress CV: RRR Abd: soft, benign, no masses Ext: No edema    Planned procedures: Proceed with colonoscopy. The patient understands the nature of the planned procedure, indications, risks, alternatives and potential complications including but not limited to bleeding, infection, perforation, damage to internal organs and possible oversedation/side effects from anesthesia. The patient agrees and gives consent to proceed.  Please refer to procedure notes for  findings, recommendations and patient disposition/instructions.     Regis Bill, MD [facility] Gastroenterology

## 2022-10-02 NOTE — Interval H&P Note (Signed)
History and Physical Interval Note:  10/02/2022 1:13 PM  Brandi Rose  has presented today for surgery, with the diagnosis of Colon Cancer Screening.  The various methods of treatment have been discussed with the patient and family. After consideration of risks, benefits and other options for treatment, the patient has consented to  Procedure(s): COLONOSCOPY WITH PROPOFOL (N/A) as a surgical intervention.  The patient's history has been reviewed, patient examined, no change in status, stable for surgery.  I have reviewed the patient's chart and labs.  Questions were answered to the patient's satisfaction.     Regis Bill  Ok to proceed with colonoscopy

## 2022-10-02 NOTE — Transfer of Care (Signed)
Immediate Anesthesia Transfer of Care Note  Patient: Leward Quan  Procedure(s) Performed: COLONOSCOPY WITH PROPOFOL POLYPECTOMY  Patient Location: PACU  Anesthesia Type:MAC  Level of Consciousness: awake  Airway & Oxygen Therapy: Patient Spontanous Breathing  Post-op Assessment: Report given to RN and Post -op Vital signs reviewed and stable  Post vital signs: Reviewed and stable  Last Vitals:  Vitals Value Taken Time  BP 131/84 10/02/22 1403  Temp 35.8 C 10/02/22 1343  Pulse 50 10/02/22 1403  Resp 19 10/02/22 1403  SpO2 92 % 10/02/22 1403  Vitals shown include unfiled device data.  Last Pain:  Vitals:   10/02/22 1403  TempSrc:   PainSc: 0-No pain         Complications: No notable events documented.

## 2022-10-03 ENCOUNTER — Encounter: Payer: Self-pay | Admitting: Gastroenterology

## 2022-10-03 LAB — SURGICAL PATHOLOGY

## 2023-04-03 ENCOUNTER — Encounter: Payer: Self-pay | Admitting: *Deleted

## 2023-04-05 ENCOUNTER — Encounter: Payer: Self-pay | Admitting: *Deleted

## 2023-04-13 ENCOUNTER — Ambulatory Visit: Admitting: Anesthesiology

## 2023-04-13 ENCOUNTER — Ambulatory Visit
Admission: RE | Admit: 2023-04-13 | Discharge: 2023-04-13 | Disposition: A | Discharge status: Home / Self Care | Payer: 59 | Attending: Gastroenterology | Admitting: Gastroenterology

## 2023-04-13 ENCOUNTER — Encounter
Admission: RE | Disposition: A | Discharge status: Home / Self Care | Payer: Self-pay | Source: Home / Self Care | Attending: Gastroenterology

## 2023-04-13 ENCOUNTER — Encounter: Payer: Self-pay | Admitting: *Deleted

## 2023-04-13 DIAGNOSIS — Z1211 Encounter for screening for malignant neoplasm of colon: Secondary | ICD-10-CM | POA: Diagnosis not present

## 2023-04-13 DIAGNOSIS — M069 Rheumatoid arthritis, unspecified: Secondary | ICD-10-CM | POA: Insufficient documentation

## 2023-04-13 DIAGNOSIS — G4730 Sleep apnea, unspecified: Secondary | ICD-10-CM | POA: Diagnosis not present

## 2023-04-13 DIAGNOSIS — E785 Hyperlipidemia, unspecified: Secondary | ICD-10-CM | POA: Insufficient documentation

## 2023-04-13 DIAGNOSIS — F418 Other specified anxiety disorders: Secondary | ICD-10-CM | POA: Insufficient documentation

## 2023-04-13 DIAGNOSIS — Z8673 Personal history of transient ischemic attack (TIA), and cerebral infarction without residual deficits: Secondary | ICD-10-CM | POA: Insufficient documentation

## 2023-04-13 DIAGNOSIS — I7300 Raynaud's syndrome without gangrene: Secondary | ICD-10-CM | POA: Insufficient documentation

## 2023-04-13 DIAGNOSIS — J45909 Unspecified asthma, uncomplicated: Secondary | ICD-10-CM | POA: Diagnosis not present

## 2023-04-13 DIAGNOSIS — E669 Obesity, unspecified: Secondary | ICD-10-CM | POA: Diagnosis not present

## 2023-04-13 DIAGNOSIS — Z9049 Acquired absence of other specified parts of digestive tract: Secondary | ICD-10-CM | POA: Insufficient documentation

## 2023-04-13 DIAGNOSIS — I10 Essential (primary) hypertension: Secondary | ICD-10-CM | POA: Diagnosis not present

## 2023-04-13 DIAGNOSIS — K640 First degree hemorrhoids: Secondary | ICD-10-CM | POA: Diagnosis not present

## 2023-04-13 DIAGNOSIS — Z6838 Body mass index (BMI) 38.0-38.9, adult: Secondary | ICD-10-CM | POA: Insufficient documentation

## 2023-04-13 DIAGNOSIS — Z860101 Personal history of adenomatous and serrated colon polyps: Secondary | ICD-10-CM | POA: Diagnosis present

## 2023-04-13 HISTORY — DX: Long term (current) use of unspecified immunomodulators and immunosuppressants: Z79.60

## 2023-04-13 HISTORY — DX: Pseudopapilledema of optic disc, bilateral: H47.333

## 2023-04-13 HISTORY — DX: Raynaud's syndrome without gangrene: I73.00

## 2023-04-13 HISTORY — DX: Sleep apnea, unspecified: G47.30

## 2023-04-13 HISTORY — DX: Personal history of transient ischemic attack (TIA), and cerebral infarction without residual deficits: Z86.73

## 2023-04-13 HISTORY — DX: Rheumatoid arthritis with rheumatoid factor of multiple sites without organ or systems involvement: M05.79

## 2023-04-13 HISTORY — DX: Personal history of other diseases of the digestive system: Z87.19

## 2023-04-13 HISTORY — DX: Pure hypercholesterolemia, unspecified: E78.00

## 2023-04-13 HISTORY — DX: Other specified anxiety disorders: F41.8

## 2023-04-13 HISTORY — DX: Cerebrovascular disease, unspecified: I67.9

## 2023-04-13 HISTORY — DX: Migraine, unspecified, not intractable, without status migrainosus: G43.909

## 2023-04-13 HISTORY — DX: Other specified abnormal immunological findings in serum: R76.8

## 2023-04-13 HISTORY — PX: COLONOSCOPY WITH PROPOFOL: SHX5780

## 2023-04-13 SURGERY — COLONOSCOPY WITH PROPOFOL
Anesthesia: General

## 2023-04-13 MED ORDER — PROPOFOL 10 MG/ML IV BOLUS
INTRAVENOUS | Status: DC | PRN
  Administered 2023-04-13: 120 ug/kg/min via INTRAVENOUS
  Administered 2023-04-13: 70 mg via INTRAVENOUS
  Administered 2023-04-13: 30 mg via INTRAVENOUS

## 2023-04-13 MED ORDER — PROPOFOL 1000 MG/100ML IV EMUL
INTRAVENOUS | Status: AC
  Filled 2023-04-13: qty 100

## 2023-04-13 MED ORDER — SODIUM CHLORIDE 0.9 % IV SOLN: INTRAVENOUS | Status: DC

## 2023-04-13 MED ORDER — LIDOCAINE HCL (CARDIAC) PF 100 MG/5ML IV SOSY
PREFILLED_SYRINGE | INTRAVENOUS | Status: DC | PRN
Start: 2023-04-13 — End: 2023-04-13
  Administered 2023-04-13: 60 mg via INTRAVENOUS

## 2023-04-13 MED ORDER — LIDOCAINE HCL (PF) 2 % IJ SOLN
INTRAMUSCULAR | Status: AC
  Filled 2023-04-13: qty 5

## 2023-04-13 NOTE — Transfer of Care (Signed)
 Immediate Anesthesia Transfer of Care Note  Patient: Brandi Rose  Procedure(s) Performed: COLONOSCOPY WITH PROPOFOL  Patient Location: PACU and Endoscopy Unit  Anesthesia Type:General  Level of Consciousness: awake  Airway & Oxygen Therapy: Patient Spontanous Breathing  Post-op Assessment: Report given to RN and Post -op Vital signs reviewed and stable  Post vital signs: Reviewed and stable  Last Vitals:  Vitals Value Taken Time  BP    Temp    Pulse    Resp 13 04/13/23 0840  SpO2    Vitals shown include unfiled device data.  Last Pain:  Vitals:   04/13/23 0752  TempSrc: Temporal  PainSc: 0-No pain         Complications: No notable events documented.

## 2023-04-13 NOTE — Anesthesia Postprocedure Evaluation (Signed)
 Anesthesia Post Note  Patient: Brandi Rose  Procedure(s) Performed: COLONOSCOPY WITH PROPOFOL  Patient location during evaluation: PACU Anesthesia Type: General Level of consciousness: awake and alert, oriented and patient cooperative Pain management: pain level controlled Vital Signs Assessment: post-procedure vital signs reviewed and stable Respiratory status: spontaneous breathing, nonlabored ventilation and respiratory function stable Cardiovascular status: blood pressure returned to baseline and stable Postop Assessment: adequate PO intake Anesthetic complications: no   No notable events documented.   Last Vitals:  Vitals:   04/13/23 0901 04/13/23 0902  BP: (!) 139/124 (!) 145/89  Pulse:    Resp: 16 11  Temp:    SpO2:      Last Pain:  Vitals:   04/13/23 0902  TempSrc:   PainSc: 0-No pain                 Reed Breech

## 2023-04-13 NOTE — H&P (Signed)
 Outpatient short stay form Pre-procedure 04/13/2023  Regis Bill, MD  Primary Physician: Rolm Gala, MD  Reason for visit:  Surveillance  History of present illness:    58 y/o lady with history of RA, HLD, and hypertension here for colonoscopy. Last colonoscopy about six months ago was inadequate prep. No blood thinners. History of appendectomy and hysterectomy. No family history of GI malignancies.     Current Facility-Administered Medications:    0.9 %  sodium chloride infusion, , Intravenous, Continuous, Tyna Huertas, Rossie Muskrat, MD  Medications Prior to Admission  Medication Sig Dispense Refill Last Dose/Taking   ARIPiprazole (ABILIFY) 5 MG tablet Take 5 mg by mouth daily.   04/12/2023   celecoxib (CELEBREX) 50 MG capsule Take 60 mg by mouth once.   Taking   certolizumab pegol (CIMZIA) 2 X 200 MG KIT Inject 200 mg into the skin every 30 (thirty) days.   Taking   hydroxychloroquine (PLAQUENIL) 200 MG tablet Take 200 mg by mouth 2 (two) times daily.   04/12/2023   lisinopril-hydrochlorothiazide (PRINZIDE,ZESTORETIC) 20-12.5 MG tablet Take 1 tablet by mouth daily.   04/12/2023   methotrexate (RHEUMATREX) 2.5 MG tablet Take by mouth. 8 pills Q friday   04/12/2023   atorvastatin (LIPITOR) 40 MG tablet Take 40 mg by mouth daily. (Patient not taking: Reported on 04/13/2023)   Not Taking   clonazePAM (KLONOPIN) 0.5 MG tablet Take 0.5 mg by mouth as needed.      HYDROcodone-acetaminophen (NORCO/VICODIN) 5-325 MG tablet Take 1-2 tablets by mouth every 6 (six) hours as needed for moderate pain or severe pain. Not more than 6 tabs in 24 hours. 20 tablet 0    levocetirizine (XYZAL) 2.5 MG/5ML solution Take 2.5 mg by mouth as needed for allergies. (Patient not taking: Reported on 04/13/2023)   Not Taking   memantine (NAMENDA) 5 MG tablet Take 5 mg by mouth 2 (two) times daily. (Patient not taking: Reported on 04/13/2023)   Not Taking   nortriptyline (PAMELOR) 50 MG capsule Take 50 mg by mouth at  bedtime.      UBRELVY 100 MG TABS TAKE 1 TABLET BY MOUTH AS DIRECTED. MAY REPEAT 2ND DOSE IN 2 HOURS. NO MORE THAN 2 DOSES PER DAY      valACYclovir (VALTREX) 1000 MG tablet Take 1,000 mg by mouth as needed.      Vilazodone HCl (VIIBRYD) 10 MG TABS Take by mouth daily. (Patient not taking: Reported on 04/13/2023)   Not Taking     Allergies  Allergen Reactions   Bupropion Anaphylaxis    Concern contributed to pancreatitis   Donepezil Other (See Comments)    Concern contributed to pancreatitis   Latex    Pravastatin    Sulfa Antibiotics Other (See Comments)    Bad headach   Vicodin [Hydrocodone-Acetaminophen]     Can not take the brand name vicodine     Past Medical History:  Diagnosis Date   ANA positive    Anemia    Anxiety    Arthritis    RA   Asthma    Cerebrovascular disease    Depression    GERD (gastroesophageal reflux disease)    Headache    History of acute pancreatitis    History of arterial ischemic stroke    History of IBS    Hypertension    Insomnia    Long-term use of immunosuppressant medication    Migraine    Pseudopapilledema of both optic discs    Pure hypercholesterolemia  Raynaud's disease without gangrene    Rheumatoid arthritis involving multiple sites with positive rheumatoid factor (HCC)    RLS (restless legs syndrome)    Situational anxiety    Sleep apnea    Stroke (HCC)    tia    Review of systems:  Otherwise negative.    Physical Exam  Gen: Alert, oriented. Appears stated age.  HEENT: PERRLA. Lungs: No respiratory distress CV: RRR Abd: soft, benign, no masses Ext: No edema    Planned procedures: Proceed with colonoscopy. The patient understands the nature of the planned procedure, indications, risks, alternatives and potential complications including but not limited to bleeding, infection, perforation, damage to internal organs and possible oversedation/side effects from anesthesia. The patient agrees and gives consent to  proceed.  Please refer to procedure notes for findings, recommendations and patient disposition/instructions.     Regis Bill, MD [facility] Gastroenterology

## 2023-04-13 NOTE — Interval H&P Note (Signed)
 History and Physical Interval Note:  04/13/2023 8:05 AM  Brandi Rose  has presented today for surgery, with the diagnosis of PH Polyps.  The various methods of treatment have been discussed with the patient and family. After consideration of risks, benefits and other options for treatment, the patient has consented to  Procedure(s): COLONOSCOPY WITH PROPOFOL (N/A) as a surgical intervention.  The patient's history has been reviewed, patient examined, no change in status, stable for surgery.  I have reviewed the patient's chart and labs.  Questions were answered to the patient's satisfaction.     Regis Bill  Ok to proceed with colonoscopy

## 2023-04-13 NOTE — Op Note (Signed)
 [facility] Gastroenterology Patient Name: Brandi Rose Procedure Date: 04/13/2023 8:01 AM MRN: [id number] Account #: [id number] Date of Birth: 1965-12-21 Admit Type: Outpatient Age: 58 Room: [facility] ENDO ROOM 3 Gender: Female Note Status: Finalized Instrument Name: Prentice Docker [id number] Procedure:             Colonoscopy Indications:           Surveillance: Personal history of adenomatous polyps,                         inadequate prep on last colonoscopy (less than 1 year                         ago) Providers:             Eather Colas MD, MD Medicines:             Monitored Anesthesia Care Complications:         No immediate complications. Procedure:             Pre-Anesthesia Assessment:                        - Prior to the procedure, a History and Physical was                         performed, and patient medications and allergies were                         reviewed. The patient is competent. The risks and                         benefits of the procedure and the sedation options and                         risks were discussed with the patient. All questions                         were answered and informed consent was obtained.                         Patient identification and proposed procedure were                         verified by the physician, the nurse, the                         anesthesiologist, the anesthetist and the technician                         in the endoscopy suite. Mental Status Examination:                         alert and oriented. Airway Examination: normal                         oropharyngeal airway and neck mobility. Respiratory                         Examination: clear to auscultation. CV Examination:  normal. Prophylactic Antibiotics: The patient does not                         require prophylactic antibiotics. Prior                         Anticoagulants: The patient has taken no  anticoagulant                         or antiplatelet agents. ASA Grade Assessment: III - A                         patient with severe systemic disease. After reviewing                         the risks and benefits, the patient was deemed in                         satisfactory condition to undergo the procedure. The                         anesthesia plan was to use monitored anesthesia care                         (MAC). Immediately prior to administration of                         medications, the patient was re-assessed for adequacy                         to receive sedatives. The heart rate, respiratory                         rate, oxygen saturations, blood pressure, adequacy of                         pulmonary ventilation, and response to care were                         monitored throughout the procedure. The physical                         status of the patient was re-assessed after the                         procedure.                        After obtaining informed consent, the colonoscope was                         passed under direct vision. Throughout the procedure,                         the patient's blood pressure, pulse, and oxygen                         saturations were monitored continuously. The  Colonoscope was introduced through the anus and                         advanced to the the cecum, identified by appendiceal                         orifice and ileocecal valve. The colonoscopy was                         performed without difficulty. The patient tolerated                         the procedure well. The quality of the bowel                         preparation was adequate to identify polyps. The                         ileocecal valve, appendiceal orifice, and rectum were                         photographed. Findings:      The perianal and digital rectal examinations were normal.      Internal hemorrhoids were found during  retroflexion. The hemorrhoids       were Grade I (internal hemorrhoids that do not prolapse).      The exam was otherwise without abnormality on direct and retroflexion       views. Impression:            - Internal hemorrhoids.                        - The examination was otherwise normal on direct and                         retroflexion views.                        - No specimens collected. Recommendation:        - Discharge patient to home.                        - Resume previous diet.                        - Continue present medications.                        - Repeat colonoscopy in 3 years for surveillance.                        - Return to referring physician as previously                         scheduled. Procedure Code(s):     --- Professional ---                        J4782, Colorectal cancer screening; colonoscopy on                         individual  at high risk Diagnosis Code(s):     --- Professional ---                        Z86.010, Personal history of colonic polyps                        K64.0, First degree hemorrhoids CPT copyright 2022 American Medical Association. All rights reserved. The codes documented in this report are preliminary and upon coder review may  be revised to meet current compliance requirements. Eather Colas MD, MD 04/13/2023 8:52:39 AM Number of Addenda: 0 Note Initiated On: 04/13/2023 8:01 AM Scope Withdrawal Time: 0 hours 7 minutes 38 seconds  Total Procedure Duration: 0 hours 14 minutes 23 seconds  Estimated Blood Loss:  Estimated blood loss: none.      [facility]

## 2023-04-13 NOTE — Anesthesia Preprocedure Evaluation (Addendum)
 Anesthesia Evaluation  Patient identified by MRN, date of birth, ID band Patient awake    Reviewed: Allergy & Precautions, NPO status , Patient's Chart, lab work & pertinent test results  History of Anesthesia Complications Negative for: history of anesthetic complications  Airway Mallampati: IV   Neck ROM: Full    Dental  (+) Missing, Implants   Pulmonary asthma , sleep apnea    Pulmonary exam normal breath sounds clear to auscultation       Cardiovascular hypertension, Normal cardiovascular exam Rhythm:Regular Rate:Normal     Neuro/Psych  Headaches PSYCHIATRIC DISORDERS Anxiety Depression    CVA    GI/Hepatic ,GERD  ,,  Endo/Other  Obesity   Renal/GU negative Renal ROS     Musculoskeletal  (+) Arthritis , Rheumatoid disorders,  Raynauds   Abdominal   Peds  Hematology  (+) Blood dyscrasia, anemia   Anesthesia Other Findings   Reproductive/Obstetrics                             Anesthesia Physical Anesthesia Plan  ASA: 3  Anesthesia Plan: General   Post-op Pain Management:    Induction: Intravenous  PONV Risk Score and Plan: 3 and Propofol infusion, TIVA and Treatment may vary due to age or medical condition  Airway Management Planned: Natural Airway  Additional Equipment:   Intra-op Plan:   Post-operative Plan:   Informed Consent: I have reviewed the patients History and Physical, chart, labs and discussed the procedure including the risks, benefits and alternatives for the proposed anesthesia with the patient or authorized representative who has indicated his/her understanding and acceptance.       Plan Discussed with: CRNA  Anesthesia Plan Comments: (LMA/GETA backup discussed.  Patient consented for risks of anesthesia including but not limited to:  - adverse reactions to medications - damage to eyes, teeth, lips or other oral mucosa - nerve damage due to  positioning  - sore throat or hoarseness - damage to heart, brain, nerves, lungs, other parts of body or loss of life  Informed patient about role of CRNA in peri- and intra-operative care.  Patient voiced understanding.)       Anesthesia Quick Evaluation

## 2023-04-16 ENCOUNTER — Encounter: Payer: Self-pay | Admitting: Gastroenterology
